# Patient Record
Sex: Male | Born: 1945 | Race: White | Hispanic: No | Marital: Married | Smoking: Never smoker
Health system: Southern US, Community
[De-identification: ages and names within clinical notes are randomized; demographics above are authoritative.]

## PROBLEM LIST (undated history)

## (undated) DIAGNOSIS — K219 Gastro-esophageal reflux disease without esophagitis: Secondary | ICD-10-CM

## (undated) DIAGNOSIS — Z951 Presence of aortocoronary bypass graft: Secondary | ICD-10-CM

## (undated) DIAGNOSIS — I8500 Esophageal varices without bleeding: Secondary | ICD-10-CM

## (undated) DIAGNOSIS — E119 Type 2 diabetes mellitus without complications: Secondary | ICD-10-CM

## (undated) DIAGNOSIS — K7469 Other cirrhosis of liver: Secondary | ICD-10-CM

## (undated) DIAGNOSIS — E079 Disorder of thyroid, unspecified: Secondary | ICD-10-CM

## (undated) DIAGNOSIS — E785 Hyperlipidemia, unspecified: Secondary | ICD-10-CM

## (undated) DIAGNOSIS — I2510 Atherosclerotic heart disease of native coronary artery without angina pectoris: Secondary | ICD-10-CM

## (undated) DIAGNOSIS — C801 Malignant (primary) neoplasm, unspecified: Secondary | ICD-10-CM

## (undated) DIAGNOSIS — I509 Heart failure, unspecified: Secondary | ICD-10-CM

## (undated) HISTORY — DX: Gastro-esophageal reflux disease without esophagitis: K21.9

## (undated) HISTORY — PX: CORONARY ARTERY BYPASS GRAFT: SHX141

## (undated) HISTORY — DX: Heart failure, unspecified: I50.9

## (undated) HISTORY — DX: Atherosclerotic heart disease of native coronary artery without angina pectoris: I25.10

## (undated) HISTORY — DX: Malignant (primary) neoplasm, unspecified: C80.1

## (undated) HISTORY — DX: Hyperlipidemia, unspecified: E78.5

## (undated) HISTORY — DX: Presence of aortocoronary bypass graft: Z95.1

## (undated) HISTORY — DX: Disorder of thyroid, unspecified: E07.9

## (undated) HISTORY — DX: Type 2 diabetes mellitus without complications: E11.9

---

## 2006-10-08 ENCOUNTER — Emergency Department: Payer: Self-pay | Admitting: Unknown Physician Specialty

## 2010-03-15 ENCOUNTER — Inpatient Hospital Stay: Payer: Self-pay | Admitting: Specialist

## 2011-02-11 ENCOUNTER — Ambulatory Visit: Payer: Self-pay | Admitting: Internal Medicine

## 2011-02-26 ENCOUNTER — Inpatient Hospital Stay: Payer: Self-pay | Admitting: *Deleted

## 2011-03-14 ENCOUNTER — Ambulatory Visit: Payer: Self-pay | Admitting: Internal Medicine

## 2011-04-13 ENCOUNTER — Ambulatory Visit: Payer: Self-pay | Admitting: Internal Medicine

## 2014-07-13 ENCOUNTER — Ambulatory Visit: Payer: Self-pay | Admitting: Oncology

## 2014-07-24 ENCOUNTER — Inpatient Hospital Stay: Payer: Self-pay | Admitting: Internal Medicine

## 2014-07-29 ENCOUNTER — Ambulatory Visit: Payer: Self-pay | Admitting: Oncology

## 2014-07-29 ENCOUNTER — Ambulatory Visit
Admit: 2014-07-29 | Disposition: A | Discharge status: Home / Self Care | Payer: Self-pay | Attending: Oncology | Admitting: Oncology

## 2014-08-11 ENCOUNTER — Ambulatory Visit
Admit: 2014-08-11 | Disposition: A | Discharge status: Home / Self Care | Payer: Self-pay | Attending: Oncology | Admitting: Oncology

## 2014-08-12 ENCOUNTER — Ambulatory Visit
Admit: 2014-08-12 | Disposition: A | Discharge status: Home / Self Care | Payer: Self-pay | Attending: Oncology | Admitting: Oncology

## 2014-08-12 ENCOUNTER — Ambulatory Visit: Payer: Self-pay | Admitting: Oncology

## 2014-08-20 ENCOUNTER — Ambulatory Visit: Payer: Self-pay | Admitting: Vascular Surgery

## 2014-09-04 LAB — CBC CANCER CENTER
Basophil #: 0 x10 3/mm (ref 0.0–0.1)
Basophil %: 1.4 %
EOS ABS: 0 x10 3/mm (ref 0.0–0.7)
Eosinophil %: 2.4 %
HCT: 31.8 % — AB (ref 40.0–52.0)
HGB: 10.5 g/dL — ABNORMAL LOW (ref 13.0–18.0)
LYMPHS ABS: 0.5 x10 3/mm — AB (ref 1.0–3.6)
Lymphocyte %: 29.1 %
MCH: 28.9 pg (ref 26.0–34.0)
MCHC: 33.1 g/dL (ref 32.0–36.0)
MCV: 87 fL (ref 80–100)
Monocyte #: 0 x10 3/mm — ABNORMAL LOW (ref 0.2–1.0)
Monocyte %: 1.7 %
Neutrophil #: 1.1 x10 3/mm — ABNORMAL LOW (ref 1.4–6.5)
Neutrophil %: 65.4 %
PLATELETS: 82 x10 3/mm — AB (ref 150–440)
RBC: 3.64 10*6/uL — ABNORMAL LOW (ref 4.40–5.90)
RDW: 17.7 % — AB (ref 11.5–14.5)
WBC: 1.6 x10 3/mm — CL (ref 3.8–10.6)

## 2014-09-04 LAB — COMPREHENSIVE METABOLIC PANEL
ALBUMIN: 3.8 g/dL
ALK PHOS: 51 U/L
ALT: 18 U/L
Anion Gap: 9 (ref 7–16)
BILIRUBIN TOTAL: 0.8 mg/dL
BUN: 10 mg/dL
Calcium, Total: 8.7 mg/dL — ABNORMAL LOW
Chloride: 104 mmol/L
Co2: 25 mmol/L
Creatinine: 0.67 mg/dL
EGFR (African American): >60
EGFR (Non-African Amer.): >60
Glucose: 180 mg/dL — ABNORMAL HIGH
Potassium: 3.6 mmol/L
SGOT(AST): 34 U/L
Sodium: 138 mmol/L
Total Protein: 6.6 g/dL

## 2014-09-10 LAB — CBC CANCER CENTER
BASOS ABS: 0 x10 3/mm (ref 0.0–0.1)
Basophil %: 2.8 %
Eosinophil #: 0 x10 3/mm (ref 0.0–0.7)
Eosinophil %: 3.1 %
HCT: 33.4 % — ABNORMAL LOW (ref 40.0–52.0)
HGB: 10.7 g/dL — ABNORMAL LOW (ref 13.0–18.0)
LYMPHS ABS: 0.4 x10 3/mm — AB (ref 1.0–3.6)
LYMPHS PCT: 44.6 %
MCH: 27.8 pg (ref 26.0–34.0)
MCHC: 32 g/dL (ref 32.0–36.0)
MCV: 87 fL (ref 80–100)
MONOS PCT: 23.7 %
Monocyte #: 0.2 x10 3/mm (ref 0.2–1.0)
NEUTROS PCT: 25.8 %
Neutrophil #: 0.2 x10 3/mm — ABNORMAL LOW (ref 1.4–6.5)
PLATELETS: 90 x10 3/mm — AB (ref 150–440)
RBC: 3.85 10*6/uL — ABNORMAL LOW (ref 4.40–5.90)
RDW: 17 % — AB (ref 11.5–14.5)
WBC: 0.8 x10 3/mm — AB (ref 3.8–10.6)

## 2014-09-10 LAB — COMPREHENSIVE METABOLIC PANEL
AST: 33 U/L
Albumin: 3.8 g/dL
Alkaline Phosphatase: 52 U/L
Anion Gap: 8 (ref 7–16)
BUN: 8 mg/dL
Bilirubin,Total: 0.5 mg/dL
CALCIUM: 9.4 mg/dL
CO2: 26 mmol/L
Chloride: 104 mmol/L
Creatinine: 0.68 mg/dL
EGFR (Non-African Amer.): >60
Glucose: 176 mg/dL — ABNORMAL HIGH
Potassium: 3.8 mmol/L
SGPT (ALT): 17 U/L
Sodium: 138 mmol/L
TOTAL PROTEIN: 6.6 g/dL

## 2014-09-10 LAB — CREATININE, SERUM: CREATINE, SERUM: 0.68

## 2014-09-11 ENCOUNTER — Ambulatory Visit
Admit: 2014-09-11 | Disposition: A | Discharge status: Home / Self Care | Payer: Self-pay | Attending: Oncology | Admitting: Oncology

## 2014-09-16 LAB — COMPREHENSIVE METABOLIC PANEL
ALBUMIN: 3.7 g/dL
ALK PHOS: 77 U/L
ANION GAP: 8 (ref 7–16)
AST: 42 U/L — AB
BUN: 9 mg/dL
Bilirubin,Total: 0.7 mg/dL
CALCIUM: 9.3 mg/dL
CHLORIDE: 103 mmol/L
Co2: 26 mmol/L
Creatinine: 0.66 mg/dL
EGFR (Non-African Amer.): >60
Glucose: 185 mg/dL — ABNORMAL HIGH
POTASSIUM: 3.7 mmol/L
SGPT (ALT): 22 U/L
Sodium: 137 mmol/L
Total Protein: 6.9 g/dL

## 2014-09-16 LAB — CBC CANCER CENTER
BASOS PCT: 0.4 %
Basophil #: 0 x10 3/mm (ref 0.0–0.1)
EOS ABS: 0.1 x10 3/mm (ref 0.0–0.7)
EOS PCT: 2.4 %
HCT: 34 % — AB (ref 40.0–52.0)
HGB: 11.1 g/dL — ABNORMAL LOW (ref 13.0–18.0)
LYMPHS PCT: 7.3 %
Lymphocyte #: 0.2 x10 3/mm — ABNORMAL LOW (ref 1.0–3.6)
MCH: 27.8 pg (ref 26.0–34.0)
MCHC: 32.5 g/dL (ref 32.0–36.0)
MCV: 86 fL (ref 80–100)
Monocyte #: 0.3 x10 3/mm (ref 0.2–1.0)
Monocyte %: 9.5 %
Neutrophil #: 2.7 x10 3/mm (ref 1.4–6.5)
Neutrophil %: 80.4 %
Platelet: 70 x10 3/mm — ABNORMAL LOW (ref 150–440)
RBC: 3.98 10*6/uL — ABNORMAL LOW (ref 4.40–5.90)
RDW: 18.4 % — ABNORMAL HIGH (ref 11.5–14.5)
WBC: 3.3 x10 3/mm — AB (ref 3.8–10.6)

## 2014-09-23 LAB — COMPREHENSIVE METABOLIC PANEL
ALK PHOS: 50 U/L
ALT: 17 U/L
Albumin: 3.9 g/dL
Anion Gap: 7 (ref 7–16)
BILIRUBIN TOTAL: 1 mg/dL
BUN: 11 mg/dL
CREATININE: 0.67 mg/dL
Calcium, Total: 9.2 mg/dL
Chloride: 102 mmol/L
Co2: 25 mmol/L
EGFR (African American): >60
EGFR (Non-African Amer.): >60
GLUCOSE: 192 mg/dL — AB
Potassium: 3.7 mmol/L
SGOT(AST): 31 U/L
Sodium: 134 mmol/L — ABNORMAL LOW
TOTAL PROTEIN: 6.7 g/dL

## 2014-09-23 LAB — CBC CANCER CENTER
BASOS ABS: 0 x10 3/mm (ref 0.0–0.1)
Basophil %: 0.4 %
Eosinophil #: 0 x10 3/mm (ref 0.0–0.7)
Eosinophil %: 2.7 %
HCT: 34.8 % — AB (ref 40.0–52.0)
HGB: 11.3 g/dL — ABNORMAL LOW (ref 13.0–18.0)
LYMPHS PCT: 11.4 %
Lymphocyte #: 0.1 x10 3/mm — ABNORMAL LOW (ref 1.0–3.6)
MCH: 27.7 pg (ref 26.0–34.0)
MCHC: 32.5 g/dL (ref 32.0–36.0)
MCV: 85 fL (ref 80–100)
Monocyte #: 0 x10 3/mm — ABNORMAL LOW (ref 0.2–1.0)
Monocyte %: 2 %
NEUTROS ABS: 0.8 x10 3/mm — AB (ref 1.4–6.5)
Neutrophil %: 83.5 %
PLATELETS: 59 x10 3/mm — AB (ref 150–440)
RBC: 4.08 10*6/uL — ABNORMAL LOW (ref 4.40–5.90)
RDW: 18.4 % — ABNORMAL HIGH (ref 11.5–14.5)
WBC: 0.9 x10 3/mm — CL (ref 3.8–10.6)

## 2014-09-30 LAB — CBC CANCER CENTER
BASOS PCT: 0.7 %
Basophil #: 0 x10 3/mm (ref 0.0–0.1)
Eosinophil #: 0 x10 3/mm (ref 0.0–0.7)
Eosinophil %: 1.3 %
HCT: 34.8 % — AB (ref 40.0–52.0)
HGB: 11.2 g/dL — AB (ref 13.0–18.0)
LYMPHS ABS: 0.1 x10 3/mm — AB (ref 1.0–3.6)
LYMPHS PCT: 7.8 %
MCH: 27.1 pg (ref 26.0–34.0)
MCHC: 32.2 g/dL (ref 32.0–36.0)
MCV: 84 fL (ref 80–100)
Monocyte #: 0.2 x10 3/mm (ref 0.2–1.0)
Monocyte %: 11.7 %
Neutrophil #: 1.5 x10 3/mm (ref 1.4–6.5)
Neutrophil %: 78.5 %
Platelet: 68 x10 3/mm — ABNORMAL LOW (ref 150–440)
RBC: 4.14 10*6/uL — ABNORMAL LOW (ref 4.40–5.90)
RDW: 19.2 % — ABNORMAL HIGH (ref 11.5–14.5)
WBC: 1.9 x10 3/mm — CL (ref 3.8–10.6)

## 2014-09-30 LAB — COMPREHENSIVE METABOLIC PANEL
ALBUMIN: 3.8 g/dL
ALK PHOS: 62 U/L
ALT: 16 U/L — AB
AST: 38 U/L
Anion Gap: 10 (ref 7–16)
BILIRUBIN TOTAL: 0.6 mg/dL
BUN: 6 mg/dL
CHLORIDE: 104 mmol/L
CREATININE: 0.64 mg/dL
Calcium, Total: 9.1 mg/dL
Co2: 25 mmol/L
EGFR (African American): >60
EGFR (Non-African Amer.): >60
Glucose: 198 mg/dL — ABNORMAL HIGH
POTASSIUM: 3.6 mmol/L
Sodium: 139 mmol/L
TOTAL PROTEIN: 6.6 g/dL

## 2014-10-05 LAB — SURGICAL PATHOLOGY

## 2014-10-07 LAB — CBC CANCER CENTER
Basophil #: 0 x10 3/mm (ref 0.0–0.1)
Basophil %: 0.8 %
EOS PCT: 0.9 %
Eosinophil #: 0 x10 3/mm (ref 0.0–0.7)
HCT: 34.2 % — ABNORMAL LOW (ref 40.0–52.0)
HGB: 11.1 g/dL — ABNORMAL LOW (ref 13.0–18.0)
LYMPHS ABS: 0.1 x10 3/mm — AB (ref 1.0–3.6)
LYMPHS PCT: 8.9 %
MCH: 27.1 pg (ref 26.0–34.0)
MCHC: 32.6 g/dL (ref 32.0–36.0)
MCV: 83 fL (ref 80–100)
MONO ABS: 0.1 x10 3/mm — AB (ref 0.2–1.0)
Monocyte %: 6 %
NEUTROS PCT: 83.4 %
Neutrophil #: 1 x10 3/mm — ABNORMAL LOW (ref 1.4–6.5)
PLATELETS: 61 x10 3/mm — AB (ref 150–440)
RBC: 4.12 10*6/uL — ABNORMAL LOW (ref 4.40–5.90)
RDW: 19.3 % — AB (ref 11.5–14.5)
WBC: 1.2 x10 3/mm — CL (ref 3.8–10.6)

## 2014-10-10 ENCOUNTER — Other Ambulatory Visit: Payer: Self-pay | Admitting: Oncology

## 2014-10-10 DIAGNOSIS — C159 Malignant neoplasm of esophagus, unspecified: Secondary | ICD-10-CM

## 2014-10-11 NOTE — Consult Note (Signed)
Pt CT scan showed a 2.7 by 3.3 cm mass at GEJ.No evidence of mediastinal or thoracic lymphadenopathy.  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 Will await brushings pathological exam.  Will possibly need to go back and biopsy the small mass.  Hgb today was 9.6 VSS, He reports no nausea, vomitin, or lite headedness.  Electronic Signatures: Manya Silvas (MD)  (Signed on 14-Feb-16 15:36)  Authored  Last Updated: 14-Feb-16 15:36 by Manya Silvas (MD)

## 2014-10-11 NOTE — Consult Note (Signed)
Pt with equivocal findings at GEJ, possible ulcerated tumor, possible Mallory Weiss tear. Concerned about possible serious bleeding from a biopsy of the ratty looking tissue and elected to brush it first.  Brushings sent for cytology.  Will repeat EGD in  few days if brushings not diagnostic.  Clear liq with Ensure for now.  Pt with low albumin and low plt ct concerned about possible cirrhosis.  No hx of known liver disease or jaundice.  Electronic Signatures: Manya Silvas (MD)  (Signed on 13-Feb-16 11:51)  Authored  Last Updated: 13-Feb-16 11:51 by Manya Silvas (MD)

## 2014-10-11 NOTE — Consult Note (Signed)
PATIENT NAME:  Mark Ford, Mark Ford MR#:  [id number] DATE OF BIRTH:  08-Oct-1945  DATE OF CONSULTATION:  07/24/2014  REFERRING PHYSICIAN:   CONSULTING PHYSICIAN:  Manya Silvas, MD  HISTORY OF PRESENT ILLNESS:  The patient is a 69 year old white male who is a veteran, who had the onset of vomiting maroon vomitus about 10:00 this morning, did it again about an hour later, came to the ER and was found to have melena and hemoglobin lower than previous and was admitted to the hospital for upper GI bleeding. I was asked to see him in consultation.   The patient says he has never vomited blood before.   The patient is not a Jehovah's Witness.   The patient denies any history of peptic ulcer disease, cirrhosis of the liver, loss of consciousness, dysphagia, diarrhea, or constipation. No asthma, wheezing. He has some recent cough, but no history of ulcer disease.  Is not a smoker or drinker. Was in the service for several years and worked at [employer] for 13 years, now is retired.   PAST SURGICAL HISTORY:  Coronary artery bypass graft surgery 2011 at [facility], surgery for broken leg on the right.   OTHER MEDICAL PROBLEMS: Include a history of diabetes for 3 years, takes pills but no insulin, has a history of gout and takes allopurinol daily, Indocin rarely for flares.   CURRENT MEDICATIONS: Glipizide 10 mg a day, aspirin 81 mg a day, allopurinol 300 mg a day, Zocor 40 mg a day, metformin 500 mg b.i.d., levothyroxine 50 mcg a day, indomethacin 50  mg p.o. q. 8 hours p.r.n.   PHYSICAL EXAMINATION:  GENERAL: White male in no acute distress.  HEENT: Sclerae not icteric. Conjunctivae negative. Tongue negative. Head is atraumatic. Trachea is in the midline.  CHEST: Clear.  HEART: Shows a 1/6 systolic murmur.  ABDOMEN: Bowel sounds present. No hepatosplenomegaly. No masses. No bruits. No significant tenderness.  VITAL SIGNS: Temperature 97.7, pulse 88, respirations 20, blood pressure 165/70.   LABORATORY DATA:  Glucose 151, BUN 26, creatinine 0.9, sodium 143, potassium 5.1, chloride 108, CO2 of 25, calcium 9.1. Lipase 123. Total protein 6.5, albumin 3, total bilirubin 0.7, alkaline phosphatase 50, SGOT 46, SGPT 25. White count 7.1, hemoglobin 12.6, platelet count 92,000.   ASSESSMENT: Upper gastrointestinal bleed, etiology unknown, most likely to be due to duodenal or gastric ulcer, possibility of gastritis or duodenitis, less likely possibility of a malignancy of the stomach, also possibility of arteriovenous malformations of the stomach or esophagitis.   PLAN: Upper endoscopy tomorrow. I explained the procedure to the patient.      ____________________________ Manya Silvas, MD rte:bu D: 07/24/2014 18:02:11 ET T: 07/24/2014 18:52:34 ET JOB#: [id number]  cc: Manya Silvas, MD, <Dictator> Manya Silvas MD ELECTRONICALLY SIGNED 08/15/2014 10:47

## 2014-10-11 NOTE — Consult Note (Signed)
Reason for Visit: This 69 year old Male patient presents to the clinic for initial evaluation of  esophageal cancer .   Referred by Dr. Grayland Ormond.  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 Upper GI demonstrated a medium-sized infiltrative ulcerated non-circumferential mass in the cardiac of the stomach extending to the GE junction. Patient also had grade 1 Biopsy was positive for moderately differentiated adenocarcinoma HER-2/neu negative. CT scan demonstrated soft tissue prominence at the GE junction with no other evidence of metastatic disease. 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 He underwent endoscopic ultrasound showing extensive of disease into the muscularis propria but not through it. A T2 lesion. PET/CT demonstrated hypermetabolic activity in the proximal stomach along the lesser curvature distal to the GE junction. Patient has been evaluated and presented at our weekly tumor conference and recommendation was made for neoadjuvant chemoradiation prior to surgical resection. He is seen today for opinion. He is doing fairly well. He is having no more nausea although does take antinausea medication. No further episodes of hematemesis.  Past Hx:    GERD:    Hypothyroidism:    Hypercholesterolemia:    gout:    CABG (Coronary Artery Bypass Graft):   Past, Family and Social History:  Past Medical History positive   Cardiovascular CABG performed; coronary artery disease; hyperlipidemia    Endocrine diabetes mellitus; hypothyroidism   Past Medical History Comments gout   Family History positive   Family History Comments family history of coronary artery disease   Social History positive   Social History Comments patient denies smoking or EtOH use history although significant history in past.   Additional Past Medical and Surgical History accompanied by his significant other today   Allergies:   No Known Allergies:   Home Meds:  Home Medications: Medication Instructions Status  pantoprazole 40 mg oral delayed release tablet 1 tab(s) orally 2 times a day Active  ferrous sulfate 325 mg oral tablet 1 tab(s) orally once a day (in the morning) Active  allopurinol 300 mg oral tablet 1 tab(s) orally once a day (in the morning) Active  GlipiZIDE XL 10 mg oral tablet, extended release 1 tab(s) orally once a day (in the morning) Active  levothyroxine 88 mcg (0.088 mg) oral tablet 1 tab(s) orally once a day (in the morning) Active  simvastatin 40 mg oral tablet 1 tab(s) orally once a day (at bedtime) Active  metFORMIN 1000 mg oral tablet 1 tab(s) orally 2 times a day Active   Review of Systems:  General negative   Performance Status (ECOG) 0   Skin negative   Breast negative   Ophthalmologic negative   ENMT negative   Respiratory and Thorax negative   Cardiovascular negative   Gastrointestinal see HPI   Genitourinary negative   Musculoskeletal negative   Neurological negative   Psychiatric negative   Hematology/Lymphatics negative   Endocrine negative   Allergic/Immunologic negative   Review of Systems   denies any weight loss, fatigue, weakness, fever, chills or night sweats. Patient denies any loss of vision, blurred vision. Patient  denies any ringing  of the ears or hearing loss. No irregular heartbeat. Patient denies heart murmur or history of fainting. Patient denies any chest pain or pain radiating to her upper extremities. Patient denies any  shortness of breath, difficulty breathing at night, cough or hemoptysis. Patient denies any swelling in the lower legs. Patient denies any nausea vomiting, vomiting of blood, or coffee ground material in the vomitus. Patient denies any stomach pain. Patient states has had normal bowel movements no significant constipation or diarrhea. Patient denies any dysuria, hematuria or significant nocturia. Patient denies any problems walking, swelling in the joints or loss of balance. Patient denies any skin changes, loss of hair or loss of weight. Patient denies any excessive worrying or anxiety or significant depression. Patient denies any problems with insomnia. Patient denies excessive thirst, polyuria, polydipsia. Patient denies any swollen glands, patient denies easy bruising or easy bleeding. Patient denies any recent infections, allergies or URI. Patient "s visual fields have not changed significantly in recent time.   Nursing Notes:  Nursing Vital Signs and Chemo Nursing Nursing Notes: *CC Vital Signs Flowsheet:   03-Mar-16 09:13  Temp Temperature 98.1  Pulse Pulse 85  Respirations Respirations 18  SBP SBP 149  DBP DBP 66  Pain Scale (0-10)  0  Current Weight (kg) (kg) 94.5  Height (cm) centimeters 168  BSA (m2) 2   Physical Exam:  General/Skin/HEENT:  General normal   Skin normal   Eyes normal   ENMT normal   Head and Neck normal   Additional PE well-developed slightly obese male in NAD. Skin no cervical or subclavicular adenopathy is appreciated. Lungs are clear to A&P cardiac examination shows regular rate and rhythm. Abdomen is benign. Positive bowel sounds in all 4 quadrants.No lower extremity edema is noted.   Breasts/Resp/CV/GI/GU:  Respiratory and Thorax normal   Cardiovascular normal   Gastrointestinal normal   Genitourinary normal   MS/Neuro/Psych/Lymph:  Musculoskeletal normal   Neurological normal   Lymphatics normal   Other Results:  Radiology  Results: LabUnknown:    14-Feb-16 13:02, CT Chest and Abd With Contrast  PACS Image   CT:  CT Chest and Abd With Contrast   REASON FOR EXAM:    (1) esophageal ulcerated mass; (2) esophageal   ulcerated mass  COMMENTS:       PROCEDURE: CT  - CT CHEST AND ABDOMEN W  - Jul 26 2014  1:02PM     CLINICAL DATA:  Recently diagnosed esophageal mass by endoscopy.  Hematemesis.    EXAM:  CT CHEST AND ABDOMEN WITH CONTRAST    TECHNIQUE:  Multidetector CT imaging of the chest and abdomen was performed  during bolus administration of intravenous contrast.  CONTRAST:  100 mL Omnipaque 350    COMPARISON:  None.    FINDINGS:  CT CHESTFINDINGS    Mediastinum/Lymph Nodes: Soft tissue prominence is seen at the  gastroesophageal junction which measures approximately 2.7 x 3.3 cm  on image 42/series 2. This likely represents the esophageal mass  recently diagnosed by endoscopy. No evidence of mediastinal or other  thoracic lymphadenopathy. No evidence of esophageal dilatation.    Lungs/Pleura: Mild atelectasis or scarring seen in both lung bases.  A tiny calcified granuloma is seen in the lateral right lung base,  but no suspicious pulmonary nodules are identified. No evidence  pulmonary infiltrate.    Musculoskeletal/Soft Tissues: No suspicious bone lesions or other  significant chest wall abnormality.    CT ABDOMEN FINDINGS  Hepatobiliary: Probable hepatic cirrhosis noted,however no liver  masses are identified. Recanalization of the periumbilical veins is  demonstrated as well as mild esophageal varices, consistent with  portal venous hypertension. No evidence of ascites. Gallbladder is  unremarkable.    Pancreas: [place], inflammatory changes, or other parenchymal  abnormality identified.    Spleen: Mild moderate splenomegaly noted with spleen measuring  approximately 16 cm in consistent with portal venous hypertension.  No splenic masses identified.    Adrenal Glands:  No  mass identified.    Kidneys:  No masses identified.  No evidence of hydronephrosis.    Stomach/Bowel/Peritoneum: Visualized portions within the abdomen are  unremarkable.    Vascular/Lymphatic: No pathologically enlarged lymph nodes  identified. No other significant abnormality noted.  Other:  None.    Musculoskeletal:  No suspicious bone lesions identified.     IMPRESSION:  Soft tissue prominence at gastroesophageal junction, likely  representing the esophageal mass recently diagnosedby endoscopy.  Correlation with results of endoscopy biopsy recommended.    No evidence of metastatic disease within the chest or abdomen.    Hepatic cirrhosis with associated findings of portal venous  hypertension including splenomegaly, paraesophageal varices, and  recanalization of periumbilical veins. No evidence of hepatic  neoplasm.  Electronically Signed    By: Earle Gell M.D.    On: 07/26/2014 15:05         Verified By: Marlaine Hind, M.D.,  Nuclear Med:    02-Mar-16 11:09, PET/CT Scan Esophageal CA Initial Staging  PET/CT Scan Esophageal CA Initial Staging   REASON FOR EXAM:    Initial Staging of Adenocarcinoma of GE Junction  COMMENTS:       PROCEDURE: PET - PET/CT INIT STG ESOPHAGUS CA  - Aug 12 2014 11:09AM     CLINICAL DATA:  Initial treatment strategy for adenocarcinoma at the  gastroesophageal junction.    EXAM:  NUCLEAR MEDICINE PET SKULL BASE TO THIGH    TECHNIQUE:  11.9 mCi F-18 FDG was injected intravenously. Full-ring PET imaging  was performed from the skull base to thigh after the radiotracer. CT  data was obtained and used for attenuation correction and anatomic  localization.    FASTING BLOOD GLUCOSE:  Value: 170 mg/dl    COMPARISON:  CT of the chest abdomen and pelvis 07/26/2014.    FINDINGS:  NECK    No hypermetabolic lymph nodes in the neck.    CHEST    No hypermetabolism noted in the esophagus. No hypermetabolic  mediastinal or hilar nodes. No  suspicious pulmonary nodules on the  CT scan. Small calcified granuloma in the periphery of the right  lower lobe. There is atherosclerosis of the thoracic aorta, the  great vessels of the mediastinum and the coronary arteries,  including calcified atherosclerotic plaque in the left main, left  anterior descending, left circumflex and right coronary arteries.  Status post median sternotomy for CABG, including LIMA to the LAD.    ABDOMEN/PELVIS    Small focus of hypermetabolism along the lesser curvature of the  stomach immediately distal to the gastroesophageal junction (SUVmax  = 6.6). However, there is also relatively diffuse hypermetabolism  along more distal aspects of thelesser curvature of the stomach  (SUVmax = 4.9- 6.0). No well-defined soft tissue mass is confidently  identified on the CT portion of the examination. The stomach is  decompressed, and the gastric mucosa appears diffusely thickened  (likely related to decompression of the stomach). No abnormal  hypermetabolic activity within the liver,  pancreas, adrenal glands,  or spleen. Diffuse low attenuation throughout the hepatic  parenchyma, compatible with hepatic steatosis. Liver has a shrunken  appearance and a nodular contour, compatible with cirrhosis.  Recannulized paraumbilical vein. Dilated portal vein (18 mm in  diameter). The spleen is mildly enlarged measuring 14.0 x 6.0 x 17.4  cm (estimated splenic volume of 731 mL). No hypermetabolic lymph  nodes in the abdomen or pelvis. Diffuse hypermetabolism throughout  the rectum and colon is presumably which may indicate underlying  proctocolitis. Numerous colonic diverticulae are noted, without  focal areas of surrounding inflammatory change to strongly suggest  an acute diverticulitis at this time.  SKELETON    No focal hypermetabolic activity to suggest skeletal metastasis.     IMPRESSION:  1. There is a small focus of hypermetabolism in the proximal  stomach  along the lesser curvature just distal to the gastroesophageal  junction, which may correlate to the known adenocarcinoma near the  GE junction. However, there is relatively diffuse low-level  hypermetabolism along other portions of the lesser curvature of the  stomach.  2. No other sites of hypermetabolism noted elsewhere in the neck,  chest, abdomen or pelvis to suggest presence of metastatic disease.  3. There is relatively diffuse hypermetabolic activity throughout  the rectum and colon, which is favored to reflect a proctocolitis.  4. Hepatic steatosis with evidence of cirrhosis, and portal  hypertension, including splenomegaly.  5. Colonic diverticulosis.  6. Atherosclerosis, including left main and 3 vessel coronary artery  disease. Status post median sternotomy for CABG, including LIMA to  the LAD.      Electronically Signed    By: Vinnie Langton M.D.    On: 08/12/2014 13:46         Verified By: Etheleen Mayhew, M.D.,   Relevent Results:   Relevant Scans and Labs PET/CT scan and CT scans are reviewed   Assessment and Plan: Impression:   stage IB large moderately differential adenocarcinoma of the GE junction in 69 year old male Plan:   case was presented her weekly tumor conference. Even though this is a stage IB it appears to be more aggressive large mass involving the GE junction and cardia of the stomach. These are classified as distal esophageal adenocarcinomas. Although definitive treatment guidelines are intubate current recommendation is for neoadjuvant chemoradiation prior to surgical resection of these lesions. I would plan on delivering 5000 cGy with concurrent chemotherapy over 5 weeks using IM RT treatment planning and delivery to spare critical structures such as the small bowel, spinal cord, liver, kidneys heart and chest. Risks and benefits of treatment including increased probability of nausea, skin reaction, fatigue, alteration of blood counts,  all were described in detail to the patient and his wife. I have set up and ordered CT simulation for next week. I discussed the case personally with medical oncology. Patient may have a port placed for chemotherapy and we will coordinate his chemotherapy after simulation next week.  I would like to take this opportunity for allowing me to participate in the care of your patient..  Fax to Physician:  Physicians To Recieve Fax: Jarome Lamas, MD - [phone].  Electronic Signatures: Jemel Ono, Roda Shutters (MD)  (Signed 03-Mar-16 11:46)  Authored: HPI, Diagnosis, Past Hx, PFSH, Allergies, Home Meds, ROS, Nursing Notes, Physical Exam, Other Results, Relevent Results, Encounter Assessment and Plan, Fax to Physician   Last Updated: 03-Mar-16 11:46 by Armstead Peaks (MD)

## 2014-10-11 NOTE — H&P (Signed)
PATIENT NAME:  Mark Ford, Mark Ford MR#:  [id number] DATE OF BIRTH:  1946-04-01  DATE OF ADMISSION:  07/24/2014  PRIMARY CARE PHYSICIAN: No local.  EMERGENCY ROOM PHYSICIAN: Dr. Kerman Passey.  CHIEF COMPLAINT: Vomiting blood today.   HISTORY OF PRESENT ILLNESS: A 69 year old Caucasian male with a history of CAD, diabetes, hyperlipidemia, GERD, who presented to the ED with the above chief complaint. The patient is alert, awake, oriented, in no acute distress. According to the patient, the patient was fine until today and the patient started vomiting maroon vomitus about 2 hours before he came to the ED. In addition, the patient noted some melena. The patient denies any headache or dizziness. No chest pain or palpitation. The patient denies any other symptoms. The patient's hemoglobin was 14, but it decreased to 12.6. Dr. Kerman Passey discussed with GI on-call physician, Dr. Gustavo Lah, who suggested to admit the patient and start Protonix drip.   PAST MEDICAL HISTORY: CAD, diabetes, hyperlipidemia, GERD, and thrombocytopenia, hypothyroidism.  SOCIAL HISTORY: No smoking, alcohol drinking, or illicit drugs.   PAST SURGICAL HISTORY: CABG, right leg surgery.   FAMILY HISTORY: Father and mother had heart disease start off in their old age. No stroke or MI.   ALLERGIES: None.  HOME MEDICATIONS: Glipizide 10 mg p.o. daily, aspirin 81 mg p.o. daily, allopurinol 300 mg p.o. daily, Zocor 40 mg p.o. daily, metformin 500 mg p.o. b.i.d., levothyroxine 50 mcg p.o. daily, indomethacin 250 mg p.o. every 8 hours p.r.n.   REVIEW OF SYSTEMS: CONSTITUTIONAL: The patient denies any fever or chills. No headache or dizziness. No weakness.  EYES: No double vision, blurred vision.  EARS, NOSE, AND THROAT: No postnasal drip, slurred speech, or dysphagia.  CARDIOVASCULAR: No chest pain, palpitation, orthopnea, or nocturnal dyspnea. No leg edema.  PULMONARY: No cough, sputum, shortness of breath, or hematemesis.   GASTROINTESTINAL: Positive for hemoptysis and melena, but no abdominal pain, nausea, vomiting, or diarrhea.  GENITOURINARY: No dysuria, hematuria, or incontinence.  SKIN: No rash or jaundice.  NEUROLOGIC: No syncope, loss of consciousness, or seizure.  ENDOCRINOLOGY: No polyuria, polydipsia, heat or cold intolerance.   PHYSICAL EXAMINATION: VITAL SIGNS: Temperature 98, blood pressure 119/56, pulse 84, oxygen saturation 98% on room air.  GENERAL: The patient is alert, awake, oriented, in no acute distress, obese.  HEENT: Pupils round, equal and reactive to light and accommodation. Moist oral mucosa. Clear oropharynx.  NECK: Supple. No JVD or carotid bruit. No lymphadenopathy. No thyromegaly.  CARDIOVASCULAR: S1, S2, regular rate and rhythm. No murmurs or gallops.  PULMONARY: Bilateral air entry. No wheezing or rales. No use of accessory muscle to breathe.  ABDOMEN: Soft. No distention or tenderness. No organomegaly. Bowel sounds present.  EXTREMITIES: No edema, clubbing, or cyanosis. No calf tenderness. Bilateral pedal pulses present.  SKIN: No rash or jaundice.  NEUROLOGICAL: Alert and oriented x 3. No focal deficit. Power 5/5. Sensory intact.   LABORATORY DATA: Troponin less than 0.02. WBC 7.1, hemoglobin 12.6, platelets 92,000. Glucose 151, BUN 26, creatinine 0.9. Electrolytes normal. Lipase of 123.   IMPRESSIONS: 1. Acute gastrointestinal bleeding.  2. Anemia, possibly due to acute blood loss.  3. History of coronary artery disease.  4. Diabetes, hyperlipidemia, thrombocytopenia, obesity.   PLAN OF TREATMENT: 1. The patient will be admitted to the medical floor. We will continue Protonix drip and  octreotide drip, and follow up hemoglobin q. 6 hours. Follow up with GI.  2. Hold aspirin.  3. For coronary artery disease, continue statin.  4. For diabetes, we will  start a sliding scale.  5. For hypothyroidism, continue thyroxine.  6. I discussed the patient's condition and plan of  treatment with the patient.   CODE STATUS: The patient wants full code.   TIME SPENT: About 55 minutes.    ____________________________ Demetrios Loll, MD qc:mw D: 07/24/2014 15:19:59 ET T: 07/24/2014 15:53:15 ET JOB#: [id number]  cc: Demetrios Loll, MD, <Dictator> Demetrios Loll MD ELECTRONICALLY SIGNED 07/24/2014 17:27

## 2014-10-11 NOTE — Consult Note (Signed)
Still waiting on pathology for report on esoph brushings.  Labs show a low B-12 and he should go on supplements.  Will give sub Q to start today.  Hgb 9.9, WBC 3.9. plt ct 62.  No new complaints.  Has poor venous access and may need a PIC line if needs to stay in hospital.  Electronic Signatures: Manya Silvas (MD)  (Signed on 16-Feb-16 11:01)  Authored  Last Updated: 16-Feb-16 11:01 by Manya Silvas (MD)

## 2014-10-11 NOTE — Op Note (Signed)
PATIENT NAME:  Mark Ford, Mark Ford MR#:  [id number] DATE OF BIRTH:  August 29, 1945  DATE OF PROCEDURE:  08/20/2014  PREOPERATIVE DIAGNOSES:  1.  Esophageal cancer with poor venous access.  2.  Coronary disease.  3.  Diabetes.   POSTOPERATIVE DIAGNOSES: 1.  Esophageal cancer with poor venous access.  2.  Coronary disease.  3.  Diabetes.   PROCEDURES:  1.  Ultrasound guidance for vascular access, right internal jugular vein.  2.  Fluoroscopic guidance for placement of catheter.  3.  Placement of CT compatible Port-A-Cath, right internal jugular vein.   SURGEON:  Algernon Huxley, MD  ANESTHESIA:  Local with moderate conscious sedation.   FLUOROSCOPY TIME:  Less than 1 minute.   CONTRAST:  Zero.   ESTIMATED BLOOD LOSS:  25 mL.  INDICATION FOR PROCEDURE: This is a 69 year old male with cancer of the esophagogastric junction. He needs a Port-A-Cath for chemotherapy and durable venous access.  Risks and benefits were discussed. Informed consent was obtained.   DESCRIPTION OF THE PROCEDURE:  The patient was brought to the vascular and interventional radiology suite. The right neck and chest were sterilely prepped and draped, and a sterile surgical field was created. Ultrasound was used to help visualize a patent right internal jugular vein. This was then accessed under direct ultrasound guidance without difficulty with a Seldinger needle and a permanent image was recorded. A J-wire was placed. After skin nick and dilatation, the peel-away sheath was then placed over the wire. I then anesthetized an area under the clavicle approximately 2 fingerbreadths. A transverse incision was created and an inferior pocket was created with electrocautery and blunt dissection. The port was then brought onto the field, placed into the pocket and secured to the chest wall with 2 Prolene sutures. The catheter was connected to the port and tunneled from the subclavicular incision to the access site. Fluoroscopic guidance was  used to cut the catheter to an appropriate length. The catheter was then placed through the peel-away sheath and the peel-away sheath was removed. The catheter tip was parked in excellent location in the mid to distal superior vena cava.  The pocket was then irrigated with antibiotic-impregnated saline and the wound was closed with a running 3-0 Vicryl and a 4-0 Monocryl. The access incision was closed with a single 4-0 Monocryl. The Huber needle was used to withdraw blood and flush the port with heparinized saline. Dermabond was then placed as a dressing. The patient tolerated the procedure well and was taken to the recovery room in stable condition.    ____________________________ Algernon Huxley, MD jsd:tr D: 08/20/2014 16:01:25 ET T: 08/20/2014 16:32:16 ET JOB#: [id number]  cc: Algernon Huxley, MD, <Dictator> Algernon Huxley MD ELECTRONICALLY SIGNED 08/31/2014 14:59

## 2014-10-11 NOTE — Consult Note (Signed)
Pt with small ulcerated mass at cardia, biopsied multiple times.  His cytology came back showing no malignancy so the biopsies were done.  Electronic Signatures for Addendum Section: Manya Silvas (MD)  (Signed Addendum 17-Feb-16 13:04) he can gp home  today on mechanical soft diet and see me in office Monday at 4pm.  Electronic Signatures: Manya Silvas (MD) (Signed on 17-Feb-16 13:03)  Authored   Last Updated: 17-Feb-16 13:04 by Manya Silvas (MD)

## 2014-10-11 NOTE — Consult Note (Signed)
Note Type Consult   Subjective: Chief Complaint/Diagnosis:   thrombocytopenia. HPI:   Patient is a 69 year old male who presented to the emergency room with complaint of vomiting blood. Patient states his symptoms started several hours before arrival. Upon workup, patient was also noted to be thrombocytopenic. EGD results from yesterday noted. Currently, patient feels well and nearly back to his baseline. He complains of no further hematemesis. He has no neurologic complaints. He denies any fevers or illnesses. He denies any weight loss. He has no chest pain or shortness of breath. He denies any constipation or diarrhea. He has no melena or hematochezia. He denies any pain. He has no urinary complaints. Patient otherwise feels well and offers no further specific complaints.   Review of Systems:  Performance Status (ECOG): 0  Review of Systems:   As per HPI. Otherwise, 10 point system review was negative.   Allergies:  No Known Allergies:   PFSH: Additional Past Medical and Surgical History: CAD, diabetes, hyperlipidemia, GERD, hypothyroidism, CABG, right leg surgery.    Family history: CAD.    Social history: Patient denies tobacco and alcohol.   Home Medications: Medication Instructions Last Modified Date/Time  aspirin 81 mg oral tablet 1 tab(s) orally once a day (in the morning) 12-Feb-16 15:04  ferrous sulfate 325 mg oral tablet 1 tab(s) orally once a day (in the morning) 12-Feb-16 15:04  allopurinol 300 mg oral tablet 1 tab(s) orally once a day (in the morning) 12-Feb-16 15:04  GlipiZIDE XL 10 mg oral tablet, extended release 1 tab(s) orally once a day (in the morning) 12-Feb-16 15:04  levothyroxine 88 mcg (0.088 mg) oral tablet 1 tab(s) orally once a day (in the morning) 12-Feb-16 15:04  simvastatin 40 mg oral tablet 1 tab(s) orally once a day (at bedtime) 12-Feb-16 15:04  metFORMIN 1000 mg oral tablet 1 tab(s) orally 2 times a day 12-Feb-16 15:04   Vital Signs:  :: vital  signs stable, patient afebrile.   Physical Exam:  General: well developed, well nourished, and in no acute distress  Mental Status: normal affect  Eyes: anicteric sclera  Head, Ears, Nose,Throat: Normocephalic, moist mucous membranes, clear oropharynx without erythema or thrush.  Neck, Thyroid: No palpable lymphadenopathy, thyroid midline without nodules.  Respiratory: clear to auscultation bilaterally  Cardiovascular: regular rate and rhythm, no murmur, rub, or gallop  Gastrointestinal: soft, nondistended, nontender, no organomegaly.  normal active bowel sounds  Musculoskeletal: No edema  Skin: No rash or petechiae noted  Neurological: alert, answering all questions appropriately.  Cranial nerves grossly intact   Laboratory Results: Routine Chem:  13-Feb-16 01:04   Glucose, Serum  127  BUN  29  Creatinine (comp) 0.97  Sodium, Serum  146  Potassium, Serum 4.1  Chloride, Serum  113  CO2, Serum 26  Calcium (Total), Serum  8.3  Anion Gap 7  Osmolality (calc) 298  eGFR (African American) >60  eGFR (Non-African American) >60 (eGFR values <26m/min/1.73 m2 may be an indication of chronic kidney disease (CKD). Calculated eGFR, using the MRDR Study equation, is useful in  patients with stable renal function. The eGFR calculation will not be reliable in acutely ill patients when serum creatinine is changing rapidly. It is not useful in patients on dialysis. The eGFR calculation may not be applicable to patients at the low and high extremes of body sizes, pregnant women, and vegetarians.)  Magnesium, Serum 1.8 (1.8-2.4 THERAPEUTIC RANGE: 4-7 mg/dL TOXIC: > 10 mg/dL  -----------------------)  14-Feb-16 04:19   Glucose, Serum  136  BUN 14  Creatinine (comp) 0.90  Sodium, Serum  146  Potassium, Serum 3.6  Chloride, Serum  109  CO2, Serum 28  Calcium (Total), Serum  8.0  Anion Gap 9  Osmolality (calc) 293  eGFR (African American) >60  eGFR (Non-African American) >60 (eGFR  values <68m/min/1.73 m2 may be an indication of chronic kidney disease (CKD). Calculated eGFR, using the MRDR Study equation, is useful in  patients with stable renal function. The eGFR calculation will not be reliable in acutely ill patients when serum creatinine is changing rapidly. It is not useful in patients on dialysis. The eGFR calculation may not be applicable to patients at the low and high extremes of body sizes, pregnant women, and vegetarians.)  Routine Hem:  13-Feb-16 01:04   Hemoglobin (CBC)  10.6  WBC (CBC) 4.6  RBC (CBC)  3.39  Hematocrit (CBC)  32.5  Platelet Count (CBC)  77  MCV 96  MCH 31.4  MCHC 32.8  RDW  14.8  Neutrophil % 59.5  Lymphocyte % 31.3  Monocyte % 6.7  Eosinophil % 2.0  Basophil % 0.5  Neutrophil # 2.8  Lymphocyte # 1.5  Monocyte # 0.3  Eosinophil # 0.1  Basophil # 0.0 (Result(s) reported on 25 Jul 2014 at 01:34AM.)  14-Feb-16 04:19   Hemoglobin (CBC)  9.6 (Result(s) reported on 26 Jul 2014 at 05:26AM.)   Assessment and Plan: Impression:   Thrombocytopenia in the setting of GI bleed. Plan:   1. Thrombocytopenia: In the setting of GI bleed, this is likely a consumptive process. Will initiate a full workup for completeness which is pending at time of dictation. No intervention is needed at this time. Patient does not require bone marrow biopsy. Once patient is discharged, please have him follow-up in the [place] 2-3 weeks for repeat laboratory work and further evaluation.Anemia: Likely secondary to GI bleed. Appreciate GI input, EGD results noted. No intervention needed at this time. Monitor. consult, call with questions.  Electronic Signatures: FDelight Hoh(MD)  (Signed 14-Feb-16 09:22)  Authored: Note Type, CC/HPI, Review of Systems, ALLERGIES, Patient Family Social History, HOME MEDICATIONS, Vital Signs, Physical Exam, Lab Results Review, Assessment and Plan   Last Updated: 14-Feb-16 09:22 by FDelight Hoh(MD)

## 2014-10-11 NOTE — Discharge Summary (Signed)
PATIENT NAME:  Mark Ford, Mark Ford MR#:  [id number] DATE OF BIRTH:  [birth year]  DATE OF ADMISSION:  07/24/2014 DATE OF DISCHARGE:  07/29/2014  PRIMARY CARE PHYSICIAN:  Nonlocal.  DISCHARGE DIAGNOSES: 1.  Acute upper gastrointestinal bleeding possibly due to ulcerated mass.  2.  Anemia due to acute blood loss.  3.  Grade 1 esophageal varices.  4.  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  5.  Coronary artery disease. 6.  Thrombocytopenia.  7.  Low vitamin B12.  PROCEDURE:  EGD twice with biopsy.  CONDITION: Stable.   CODE STATUS: Full code.   HOME MEDICATIONS: Please refer to the medication reconciliation list.   DIET: Low-sodium, low-fat, low-cholesterol, ADA diet.   ACTIVITY: As tolerated.   FOLLOWUP CARE:  Follow up with PCP within 1-2 weeks.  Follow up with Dr. Vira Agar within  1 week.  Also, patient needs followup with Dr. Grayland Ormond for thrombocytopenia.   REASON FOR ADMISSION: Vomiting blood 1 day.   HISTORY OF PRESENT ILLNESS: A 69 year old Caucasian male with a history of CAD, diabetes, hyperlipidemia, who presented to the ED with vomiting blood. The patient's hemoglobin was 14, but it decreased to 12.6. He was started with a Protonix drip in ED.  For detailed history and physical examination, please refer to the admission note dictated by me. 1.  For acute upper GI bleeding, the patient was treated with Protonix drip and octreotide drip.    Aspirin was discontinued. Dr. Vira Agar did an EGD with esophageal brushings of esophageal ulcer.  After EGD, Dr. Vira Agar suggested and changed to Protonix IV b.i.d. Since he suspected esophageal mass, he suggested CAT scan of abdomen and chest which showed possible esophageal mass without metastasis. Dr. Vira Agar did an EGD again today with biopsy which showed ulcerated mass at cardia. Dr. Vira Agar did a biopsy and suggested to continue Protonix p.o. b.i.d. He suggested patient be discharged to home today. 2. Grade  1 esophageal varices with hepatitic cirrhosis according to CAT scan of abdomen and pelvis. The patient needs to follow up with Dr. Vira Agar as outpatient.  3.  Anemia due to acute blood loss. The patient's hemoglobin decreased to 9.6 and then up to   9.9, so far is stable. The patient has no active bleeding. Need to follow up hemoglobin as outpatient.  4.  History of CAD, aspirin was discontinued. Patient is to continue statin.  5.  Thrombocytopenia.  According to Dr. Grayland Ormond, is stable. The patient needs to follow up with Dr. Grayland Ormond as outpatient.   The patient has no complaints, is status post EGD today. Vital signs are stable.   Physical examination is unremarkable.  The patient  is clinically stable, will be discharged to home today. I discussed the patient's discharge plan with the patient, the patient's wife and Dr. Vira Agar, nurse, and the case manager.   TIME SPENT: About 45 minutes.    ____________________________ Demetrios Loll, MD qc:LT D: 07/29/2014 17:17:00 ET T: 07/29/2014 17:53:29 ET JOB#: [id number]  cc: Demetrios Loll, MD, <Dictator>   Demetrios Loll MD ELECTRONICALLY SIGNED 07/30/2014 18:07

## 2014-10-11 NOTE — Consult Note (Signed)
Patient's platelet count is decreased, but relatively stable. Iron stores are borderline low, but he does not require IV Feraheme at this time. B12 levels were also found to be low and patient received 1000 g IM B-12 earlier today. Still awaiting pathology results from biopsy. Patient does not require bone marrow biopsy. No further intervention is needed at this time.  continue to follow.  Electronic Signatures: Finnegan, Timothy (MD)  (Signed on 16-Feb-16 13:25)  Authored  Last Updated: 16-Feb-16 13:25 by Finnegan, Timothy (MD)  

## 2014-10-11 NOTE — Consult Note (Signed)
Waiting on endoscopic brushings.  If they are diagnostic no further studies needed.  If not then need repeat EGD with biopsies.  Endoscopic ultrasound may be of great benefit for diagnostic evaluation and possible endoscopic removal if the EUS findings suggest that it can be done.  Pt understands we are waiting on the pathology before moving to next step.  Electronic Signatures: Manya Silvas (MD)  (Signed on 15-Feb-16 17:46)  Authored  Last Updated: 15-Feb-16 17:46 by Manya Silvas (MD)

## 2014-10-14 ENCOUNTER — Inpatient Hospital Stay: Payer: Medicare Other | Attending: Internal Medicine

## 2014-10-14 DIAGNOSIS — C160 Malignant neoplasm of cardia: Secondary | ICD-10-CM | POA: Diagnosis not present

## 2014-10-14 DIAGNOSIS — C159 Malignant neoplasm of esophagus, unspecified: Secondary | ICD-10-CM

## 2014-10-14 LAB — CBC WITH DIFFERENTIAL/PLATELET
Basophils Absolute: 0 10*3/uL (ref 0–0.1)
EOS ABS: 0 10*3/uL (ref 0–0.7)
Eosinophils Relative: 1 %
HCT: 32.6 % — ABNORMAL LOW (ref 40.0–52.0)
Hemoglobin: 10.5 g/dL — ABNORMAL LOW (ref 13.0–18.0)
Lymphocytes Relative: 20 %
Lymphs Abs: 0.5 10*3/uL — ABNORMAL LOW (ref 1.0–3.6)
MCH: 26.8 pg (ref 26.0–34.0)
MCHC: 32.3 g/dL (ref 32.0–36.0)
MCV: 83.1 fL (ref 80.0–100.0)
Monocytes Absolute: 0.5 10*3/uL (ref 0.2–1.0)
Neutro Abs: 1.3 10*3/uL — ABNORMAL LOW (ref 1.4–6.5)
Neutrophils Relative %: 56 %
PLATELETS: 91 10*3/uL — AB (ref 150–440)
RBC: 3.92 MIL/uL — ABNORMAL LOW (ref 4.40–5.90)
RDW: 20.7 % — ABNORMAL HIGH (ref 11.5–14.5)
WBC: 2.4 10*3/uL — ABNORMAL LOW (ref 3.8–10.6)

## 2014-10-20 ENCOUNTER — Emergency Department
Admission: EM | Admit: 2014-10-20 | Discharge: 2014-10-20 | Disposition: A | Discharge status: Home / Self Care | Payer: Medicare Other | Attending: Emergency Medicine | Admitting: Emergency Medicine

## 2014-10-20 ENCOUNTER — Telehealth: Payer: Self-pay | Admitting: Emergency Medicine

## 2014-10-20 DIAGNOSIS — K625 Hemorrhage of anus and rectum: Secondary | ICD-10-CM | POA: Diagnosis present

## 2014-10-20 DIAGNOSIS — Z79899 Other long term (current) drug therapy: Secondary | ICD-10-CM | POA: Insufficient documentation

## 2014-10-20 DIAGNOSIS — K644 Residual hemorrhoidal skin tags: Secondary | ICD-10-CM | POA: Insufficient documentation

## 2014-10-20 DIAGNOSIS — E119 Type 2 diabetes mellitus without complications: Secondary | ICD-10-CM | POA: Insufficient documentation

## 2014-10-20 LAB — COMPREHENSIVE METABOLIC PANEL
ALT: 20 U/L (ref 17–63)
ANION GAP: 10 (ref 5–15)
AST: 42 U/L — ABNORMAL HIGH (ref 15–41)
Albumin: 3.5 g/dL (ref 3.5–5.0)
Alkaline Phosphatase: 81 U/L (ref 38–126)
BUN: 7 mg/dL (ref 6–20)
CO2: 24 mmol/L (ref 22–32)
CREATININE: 0.58 mg/dL — AB (ref 0.61–1.24)
Calcium: 9.2 mg/dL (ref 8.9–10.3)
Chloride: 105 mmol/L (ref 101–111)
GFR calc non Af Amer: >60 mL/min (ref >60)
GLUCOSE: 125 mg/dL — AB (ref 65–99)
Potassium: 3.9 mmol/L (ref 3.5–5.1)
SODIUM: 139 mmol/L (ref 135–145)
TOTAL PROTEIN: 6.4 g/dL — AB (ref 6.5–8.1)
Total Bilirubin: 0.9 mg/dL (ref 0.3–1.2)

## 2014-10-20 LAB — CBC
HCT: 34.5 % — ABNORMAL LOW (ref 40.0–52.0)
Hemoglobin: 11.2 g/dL — ABNORMAL LOW (ref 13.0–18.0)
MCH: 27.3 pg (ref 26.0–34.0)
MCHC: 32.4 g/dL (ref 32.0–36.0)
MCV: 84.4 fL (ref 80.0–100.0)
PLATELETS: 85 10*3/uL — AB (ref 150–440)
RBC: 4.09 MIL/uL — AB (ref 4.40–5.90)
RDW: 22.7 % — ABNORMAL HIGH (ref 11.5–14.5)
WBC: 3.4 10*3/uL — ABNORMAL LOW (ref 3.8–10.6)

## 2014-10-20 MED ORDER — HYDROCORTISONE ACETATE 25 MG RE SUPP: 25.0000 mg | Freq: Two times a day (BID) | RECTAL | Status: DC

## 2014-10-20 NOTE — Discharge Instructions (Signed)

## 2014-10-20 NOTE — ED Provider Notes (Signed)
[facility] Emergency Department Provider Note  ____________________________________________  Time seen: 5:50 AM  I have reviewed the triage vital signs and the nursing notes.   HISTORY  Chief Complaint No chief complaint on file.      HPI Mark Ford is a 69 y.o. male presents with rectal bleeding since last Wednesday area patient admits to "knots inside his rectum". Patient denies any abdominal pain of note patient has a history of cirrhosis in addition to esophageal cancer for which he recently underwent radiation therapy and chemotherapy. Patient denies any history of hemorrhoids in the past.     Past Medical History  Diagnosis Date  . Cancer     adenocarcinoma fo the GE Junction  . GERD (gastroesophageal reflux disease)   . Diabetes mellitus without complication   . Thyroid disease     hypothroidism  . CAD (coronary artery disease)   . S/P CABG (coronary artery bypass graft)   . Hyperlipidemia     There are no active problems to display for this patient.   Past Surgical History  Procedure Laterality Date  . Coronary artery bypass graft      Current Outpatient Rx  Name  Route  Sig  Dispense  Refill  . allopurinol (ZYLOPRIM) 300 MG tablet   Oral   Take 300 mg by mouth daily. Take in the morning         . glipiZIDE (GLUCOTROL XL) 10 MG 24 hr tablet   Oral   Take 10 mg by mouth daily with breakfast.         . levothyroxine (SYNTHROID, LEVOTHROID) 100 MCG tablet   Oral   Take 100 mcg by mouth daily before breakfast.          . metFORMIN (GLUCOPHAGE) 1000 MG tablet   Oral   Take 1,000 mg by mouth 2 (two) times daily with a meal.         . simvastatin (ZOCOR) 40 MG tablet   Oral   Take 40 mg by mouth daily. Take at bedtime           Allergies Shellfish allergy and Tylenol  No family history on file.  Social History History  Substance Use Topics  . Smoking status: Not on file  . Smokeless tobacco: Not on  file  . Alcohol Use: Not on file    Review of Systems  Constitutional: Negative for fever. Eyes: Negative for visual changes. ENT: Negative for sore throat. Cardiovascular: Negative for chest pain. Respiratory: Negative for shortness of breath. Gastrointestinal: Negative for abdominal pain, vomiting and diarrhea. Genitourinary: Negative for dysuria. Positive rectal bleeding Musculoskeletal: Negative for back pain. Skin: Negative for rash. Neurological: Negative for headaches, focal weakness or numbness.   10-point ROS otherwise negative.  ____________________________________________   PHYSICAL EXAM:  VITAL SIGNS: ED Triage Vitals  Enc Vitals Group     BP 10/20/14 0336 115/71 mmHg     Pulse Rate 10/20/14 0336 89     Resp 10/20/14 0336 20     Temp 10/20/14 0336 98 F (36.7 C)     Temp Source 10/20/14 0336 Oral     SpO2 10/20/14 0336 96 %     Weight 10/20/14 0336 195 lb (88.451 kg)     Height 10/20/14 0336 5\' 6"  (1.676 m)     Head Cir --      Peak Flow --      Pain Score 10/20/14 0405 7     Pain Loc --  Pain Edu? --      Excl. in [place]? --      Constitutional: Alert and oriented. Well appearing and in no distress. Eyes: Conjunctivae are normal. PERRL. Normal extraocular movements. ENT   Head: Normocephalic and atraumatic.   Nose: No congestion/rhinnorhea.   Mouth/Throat: Mucous membranes are moist.   Neck: No stridor. Hematological/Lymphatic/Immunilogical: No cervical lymphadenopathy. Cardiovascular: Normal rate, regular rhythm. Normal and symmetric distal pulses are present in all extremities. No murmurs, rubs, or gallops. Respiratory: Normal respiratory effort without tachypnea nor retractions. Breath sounds are clear and equal bilaterally. No wheezes/rales/rhonchi. Gastrointestinal: Soft and nontender. No distention. There is no CVA tenderness. RECTAL: Patient has multiple external hemorrhoids noted on exam with active bleeding Genitourinary:  deferred Musculoskeletal: Nontender with normal range of motion in all extremities. No joint effusions.  No lower extremity tenderness nor edema. Neurologic:  Normal speech and language. No gross focal neurologic deficits are appreciated. Speech is normal.  Skin:  Skin is warm, dry and intact. No rash noted. Psychiatric: Mood and affect are normal. Speech and behavior are normal. Patient exhibits appropriate insight and judgment.  ____________________________________________    LABS (pertinent positives/negatives)  Labs Reviewed  CBC - Abnormal; Notable for the following:    WBC 3.4 (*)    RBC 4.09 (*)    Hemoglobin 11.2 (*)    HCT 34.5 (*)    RDW 22.7 (*)    Platelets 85 (*)    All other components within normal limits  COMPREHENSIVE METABOLIC PANEL - Abnormal; Notable for the following:    Glucose, Bld 125 (*)    Creatinine, Ser 0.58 (*)    Total Protein 6.4 (*)    AST 42 (*)    All other components within normal limits     ____________________________________________     ____________________________________________     ____________________________________________   INITIAL IMPRESSION / ASSESSMENT AND PLAN / ED COURSE  Pertinent labs & imaging results that were available during my care of the patient were reviewed by me and considered in my medical decision making (see chart for details).  History and physical exam consistent with external hemorrhoids. Patient's hemoglobin and hematocrit 11.2 and 34. As such we'll refer patient to general surgery for outpatient management.  ____________________________________________   FINAL CLINICAL IMPRESSION(S) / ED DIAGNOSES  Final diagnoses:  External hemorrhoids without complication      Gregor Hams, MD 10/21/14 2313

## 2014-10-20 NOTE — ED Notes (Signed)
Patient ambulatory to triage with steady gait, without difficulty or distress noted; mask in place; pt reports currently undergoing chemo and radiation for esophageal/stomach CA; st has noted some knots inside rectum that are bleeding since Wednesday; pt denies any accomp symptoms

## 2014-10-20 NOTE — ED Notes (Signed)
[facility] calling , pt is currently at the pharmacy waiting for RX that Dr.Brown wrote last night, review of chart finding Anusol (rectal) route RX,

## 2014-11-11 ENCOUNTER — Inpatient Hospital Stay: Payer: Medicare Other | Attending: Internal Medicine

## 2014-11-11 ENCOUNTER — Inpatient Hospital Stay: Payer: Medicare Other

## 2014-11-11 ENCOUNTER — Other Ambulatory Visit: Payer: Self-pay

## 2014-11-11 ENCOUNTER — Ambulatory Visit: Payer: Self-pay | Admitting: Oncology

## 2014-11-11 ENCOUNTER — Other Ambulatory Visit: Payer: Self-pay | Admitting: Oncology

## 2014-11-11 ENCOUNTER — Inpatient Hospital Stay (HOSPITAL_BASED_OUTPATIENT_CLINIC_OR_DEPARTMENT_OTHER): Payer: Medicare Other | Admitting: Oncology

## 2014-11-11 VITALS — BP 157/72 | HR 92 | Temp 97.8°F | Resp 20 | Wt 194.9 lb

## 2014-11-11 DIAGNOSIS — K219 Gastro-esophageal reflux disease without esophagitis: Secondary | ICD-10-CM | POA: Diagnosis not present

## 2014-11-11 DIAGNOSIS — E785 Hyperlipidemia, unspecified: Secondary | ICD-10-CM

## 2014-11-11 DIAGNOSIS — C801 Malignant (primary) neoplasm, unspecified: Secondary | ICD-10-CM

## 2014-11-11 DIAGNOSIS — E119 Type 2 diabetes mellitus without complications: Secondary | ICD-10-CM | POA: Insufficient documentation

## 2014-11-11 DIAGNOSIS — C159 Malignant neoplasm of esophagus, unspecified: Secondary | ICD-10-CM

## 2014-11-11 DIAGNOSIS — C160 Malignant neoplasm of cardia: Secondary | ICD-10-CM

## 2014-11-11 DIAGNOSIS — K229 Disease of esophagus, unspecified: Secondary | ICD-10-CM

## 2014-11-11 DIAGNOSIS — E039 Hypothyroidism, unspecified: Secondary | ICD-10-CM | POA: Diagnosis not present

## 2014-11-11 DIAGNOSIS — I2510 Atherosclerotic heart disease of native coronary artery without angina pectoris: Secondary | ICD-10-CM | POA: Diagnosis not present

## 2014-11-11 DIAGNOSIS — Z79899 Other long term (current) drug therapy: Secondary | ICD-10-CM

## 2014-11-11 DIAGNOSIS — Z452 Encounter for adjustment and management of vascular access device: Secondary | ICD-10-CM | POA: Diagnosis not present

## 2014-11-11 DIAGNOSIS — E079 Disorder of thyroid, unspecified: Secondary | ICD-10-CM

## 2014-11-11 DIAGNOSIS — Z923 Personal history of irradiation: Secondary | ICD-10-CM | POA: Insufficient documentation

## 2014-11-11 DIAGNOSIS — K7460 Unspecified cirrhosis of liver: Secondary | ICD-10-CM | POA: Diagnosis not present

## 2014-11-11 DIAGNOSIS — Z9221 Personal history of antineoplastic chemotherapy: Secondary | ICD-10-CM | POA: Insufficient documentation

## 2014-11-11 DIAGNOSIS — D696 Thrombocytopenia, unspecified: Secondary | ICD-10-CM | POA: Insufficient documentation

## 2014-11-11 DIAGNOSIS — D649 Anemia, unspecified: Secondary | ICD-10-CM | POA: Diagnosis not present

## 2014-11-11 DIAGNOSIS — D701 Agranulocytosis secondary to cancer chemotherapy: Secondary | ICD-10-CM | POA: Insufficient documentation

## 2014-11-11 DIAGNOSIS — J8410 Pulmonary fibrosis, unspecified: Secondary | ICD-10-CM | POA: Insufficient documentation

## 2014-11-11 DIAGNOSIS — Z951 Presence of aortocoronary bypass graft: Secondary | ICD-10-CM | POA: Insufficient documentation

## 2014-11-11 DIAGNOSIS — K766 Portal hypertension: Secondary | ICD-10-CM | POA: Diagnosis not present

## 2014-11-11 LAB — CBC WITH DIFFERENTIAL/PLATELET
Basophils Absolute: 0 10*3/uL (ref 0–0.1)
Basophils Relative: 1 %
Eosinophils Absolute: 0.1 10*3/uL (ref 0–0.7)
HCT: 37 % — ABNORMAL LOW (ref 40.0–52.0)
HEMOGLOBIN: 11.9 g/dL — AB (ref 13.0–18.0)
Lymphs Abs: 1 10*3/uL (ref 1.0–3.6)
MCH: 28.1 pg (ref 26.0–34.0)
MCHC: 32.2 g/dL (ref 32.0–36.0)
MCV: 87.2 fL (ref 80.0–100.0)
Monocytes Absolute: 0.5 10*3/uL (ref 0.2–1.0)
Monocytes Relative: 10 %
NEUTROS ABS: 2.9 10*3/uL (ref 1.4–6.5)
Neutrophils Relative %: 64 %
Platelets: 79 10*3/uL — ABNORMAL LOW (ref 150–440)
RBC: 4.24 MIL/uL — ABNORMAL LOW (ref 4.40–5.90)
RDW: 25.3 % — AB (ref 11.5–14.5)
WBC: 4.5 10*3/uL (ref 3.8–10.6)

## 2014-11-11 LAB — BASIC METABOLIC PANEL
ANION GAP: 6 (ref 5–15)
BUN: 12 mg/dL (ref 6–20)
CHLORIDE: 105 mmol/L (ref 101–111)
CO2: 25 mmol/L (ref 22–32)
Calcium: 9.8 mg/dL (ref 8.9–10.3)
Creatinine, Ser: 0.53 mg/dL — ABNORMAL LOW (ref 0.61–1.24)
GFR calc Af Amer: >60 mL/min (ref >60)
GFR calc non Af Amer: >60 mL/min (ref >60)
GLUCOSE: 103 mg/dL — AB (ref 65–99)
Potassium: 3.9 mmol/L (ref 3.5–5.1)
SODIUM: 136 mmol/L (ref 135–145)

## 2014-11-11 MED ORDER — HEPARIN SOD (PORK) LOCK FLUSH 100 UNIT/ML IV SOLN
INTRAVENOUS | Status: AC
  Filled 2014-11-11: qty 5

## 2014-11-11 MED ORDER — HEPARIN SOD (PORK) LOCK FLUSH 100 UNIT/ML IV SOLN
500.0000 [IU] | Freq: Once | INTRAVENOUS | Status: AC
  Administered 2014-11-11: 500 [IU] via INTRAVENOUS

## 2014-11-11 MED ORDER — SODIUM CHLORIDE 0.9 % IJ SOLN
10.0000 mL | INTRAMUSCULAR | Status: AC | PRN
  Administered 2014-11-11: 10 mL via INTRAVENOUS
  Filled 2014-11-11: qty 10

## 2014-11-11 NOTE — Progress Notes (Unsigned)
Survivorship Care visit completed.  Survivorship Care Plan given and explained to patient.  Resources given and talked about Cancer Transitions and CARE program.  [facility] answers Survivorship booklet given.  Patient verbalized understanding.  Encouraged patient and wife to utilized resources available.

## 2014-11-25 ENCOUNTER — Ambulatory Visit
Admission: RE | Admit: 2014-11-25 | Discharge: 2014-11-25 | Disposition: A | Discharge status: Home / Self Care | Payer: Medicare Other | Source: Ambulatory Visit | Attending: Radiation Oncology | Admitting: Radiation Oncology

## 2014-11-25 ENCOUNTER — Encounter: Payer: Self-pay | Admitting: Radiation Oncology

## 2014-11-25 VITALS — BP 158/71 | HR 88 | Temp 97.6°F | Wt 197.1 lb

## 2014-11-25 DIAGNOSIS — C801 Malignant (primary) neoplasm, unspecified: Secondary | ICD-10-CM

## 2014-11-25 NOTE — Progress Notes (Signed)
Radiation Oncology Follow up Note  Name: Mark Ford   Date:   11/25/2014 MRN:  [id number] DOB: [birth year]    This 69 y.o. 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 PET CT demonstrated hypermetabolic activity in the proximal stomach along the left lesser curvature distal to the GE junction. He is seen today in routine follow-up and is doing well. Specifically denies dysphagia cough or weight loss. He is scheduled for a PET CT scan in about a week's time. He also will be scheduled for follow-up upper endoscopy and surgical consultation in the near future by medical oncology..  COMPLICATIONS OF TREATMENT: none  FOLLOW UP COMPLIANCE: keeps appointments   PHYSICAL EXAM:  BP 158/71 mmHg  Pulse 88  Temp(Src) 97.6 F (36.4 C)  Wt 197 lb 1.5 oz (89.4 kg) Well-developed well-nourished patient in NAD. HEENT reveals PERLA, EOMI, discs not visualized.  Oral cavity is clear. No oral mucosal lesions are identified. Neck is clear without evidence of cervical or supraclavicular adenopathy. Lungs are clear to A&P. Cardiac examination is essentially unremarkable with regular rate and rhythm without murmur rub or thrill. Abdomen is benign with no organomegaly or masses noted. Motor sensory and DTR levels are equal and symmetric in the upper and lower extremities. Cranial nerves II through XII are grossly intact. Proprioception is intact. No peripheral adenopathy or edema is identified. No motor or sensory levels are noted. Crude visual fields are within normal range.   RADIOLOGY RESULTS: Will review PET CT scan when they  become available  PLAN: At the present time patient or to has follow-up appointment with medical oncology after completion of the PET CT scan in about a week's time. Patient will probably need repeat upper endoscopy in the near future and surgical consultation. I have asked to see him back in about 4 months for follow-up. I am please was overall progress. Patient is to call sooner with any concerns.  I would like to take this opportunity for allowing me to participate in the care of your patient.Armstead Peaks., MD

## 2014-12-02 ENCOUNTER — Ambulatory Visit
Admission: RE | Admit: 2014-12-02 | Discharge: 2014-12-02 | Disposition: A | Discharge status: Home / Self Care | Payer: Medicare Other | Source: Ambulatory Visit | Attending: Oncology | Admitting: Oncology

## 2014-12-02 DIAGNOSIS — K766 Portal hypertension: Secondary | ICD-10-CM | POA: Diagnosis not present

## 2014-12-02 DIAGNOSIS — C159 Malignant neoplasm of esophagus, unspecified: Secondary | ICD-10-CM | POA: Diagnosis present

## 2014-12-02 DIAGNOSIS — K7460 Unspecified cirrhosis of liver: Secondary | ICD-10-CM | POA: Insufficient documentation

## 2014-12-02 LAB — GLUCOSE, CAPILLARY: GLUCOSE-CAPILLARY: 142 mg/dL — AB (ref 65–99)

## 2014-12-02 MED ORDER — FLUDEOXYGLUCOSE F - 18 (FDG) INJECTION
13.1000 | Freq: Once | INTRAVENOUS | Status: AC | PRN
  Administered 2014-12-02: 13.1 via INTRAVENOUS

## 2014-12-07 ENCOUNTER — Other Ambulatory Visit: Payer: Self-pay | Admitting: *Deleted

## 2014-12-07 DIAGNOSIS — C159 Malignant neoplasm of esophagus, unspecified: Secondary | ICD-10-CM

## 2014-12-09 ENCOUNTER — Inpatient Hospital Stay (HOSPITAL_BASED_OUTPATIENT_CLINIC_OR_DEPARTMENT_OTHER): Payer: Medicare Other | Admitting: Oncology

## 2014-12-09 ENCOUNTER — Inpatient Hospital Stay: Payer: Medicare Other

## 2014-12-09 VITALS — BP 164/73 | HR 83 | Temp 98.2°F | Resp 20 | Wt 196.0 lb

## 2014-12-09 DIAGNOSIS — J8410 Pulmonary fibrosis, unspecified: Secondary | ICD-10-CM

## 2014-12-09 DIAGNOSIS — Z79899 Other long term (current) drug therapy: Secondary | ICD-10-CM

## 2014-12-09 DIAGNOSIS — D701 Agranulocytosis secondary to cancer chemotherapy: Secondary | ICD-10-CM | POA: Diagnosis not present

## 2014-12-09 DIAGNOSIS — D696 Thrombocytopenia, unspecified: Secondary | ICD-10-CM | POA: Diagnosis not present

## 2014-12-09 DIAGNOSIS — Z923 Personal history of irradiation: Secondary | ICD-10-CM

## 2014-12-09 DIAGNOSIS — K7460 Unspecified cirrhosis of liver: Secondary | ICD-10-CM

## 2014-12-09 DIAGNOSIS — C160 Malignant neoplasm of cardia: Secondary | ICD-10-CM | POA: Diagnosis not present

## 2014-12-09 DIAGNOSIS — D649 Anemia, unspecified: Secondary | ICD-10-CM

## 2014-12-09 DIAGNOSIS — E119 Type 2 diabetes mellitus without complications: Secondary | ICD-10-CM

## 2014-12-09 DIAGNOSIS — I2510 Atherosclerotic heart disease of native coronary artery without angina pectoris: Secondary | ICD-10-CM

## 2014-12-09 DIAGNOSIS — E079 Disorder of thyroid, unspecified: Secondary | ICD-10-CM

## 2014-12-09 DIAGNOSIS — C159 Malignant neoplasm of esophagus, unspecified: Secondary | ICD-10-CM

## 2014-12-09 DIAGNOSIS — E039 Hypothyroidism, unspecified: Secondary | ICD-10-CM

## 2014-12-09 DIAGNOSIS — Z452 Encounter for adjustment and management of vascular access device: Secondary | ICD-10-CM

## 2014-12-09 DIAGNOSIS — Z951 Presence of aortocoronary bypass graft: Secondary | ICD-10-CM

## 2014-12-09 DIAGNOSIS — K219 Gastro-esophageal reflux disease without esophagitis: Secondary | ICD-10-CM

## 2014-12-09 DIAGNOSIS — D61818 Other pancytopenia: Secondary | ICD-10-CM

## 2014-12-09 DIAGNOSIS — K766 Portal hypertension: Secondary | ICD-10-CM

## 2014-12-09 DIAGNOSIS — Z9221 Personal history of antineoplastic chemotherapy: Secondary | ICD-10-CM

## 2014-12-09 DIAGNOSIS — E785 Hyperlipidemia, unspecified: Secondary | ICD-10-CM

## 2014-12-09 LAB — COMPREHENSIVE METABOLIC PANEL
ALBUMIN: 3.9 g/dL (ref 3.5–5.0)
ALT: 21 U/L (ref 17–63)
ANION GAP: 5 (ref 5–15)
AST: 40 U/L (ref 15–41)
Alkaline Phosphatase: 79 U/L (ref 38–126)
BUN: 8 mg/dL (ref 6–20)
CHLORIDE: 104 mmol/L (ref 101–111)
CO2: 26 mmol/L (ref 22–32)
Calcium: 9.2 mg/dL (ref 8.9–10.3)
Creatinine, Ser: 0.72 mg/dL (ref 0.61–1.24)
GFR calc Af Amer: >60 mL/min (ref >60)
Glucose, Bld: 145 mg/dL — ABNORMAL HIGH (ref 65–99)
Potassium: 3.8 mmol/L (ref 3.5–5.1)
Sodium: 135 mmol/L (ref 135–145)
Total Bilirubin: 1 mg/dL (ref 0.3–1.2)
Total Protein: 7 g/dL (ref 6.5–8.1)

## 2014-12-09 LAB — CBC WITH DIFFERENTIAL/PLATELET
BASOS PCT: 1 %
Basophils Absolute: 0 10*3/uL (ref 0–0.1)
EOS ABS: 0.1 10*3/uL (ref 0–0.7)
Eosinophils Relative: 2 %
HCT: 36.6 % — ABNORMAL LOW (ref 40.0–52.0)
HEMOGLOBIN: 11.8 g/dL — AB (ref 13.0–18.0)
Lymphocytes Relative: 22 %
Lymphs Abs: 0.5 10*3/uL — ABNORMAL LOW (ref 1.0–3.6)
MCH: 28.5 pg (ref 26.0–34.0)
MCHC: 32.2 g/dL (ref 32.0–36.0)
MCV: 88.6 fL (ref 80.0–100.0)
MONO ABS: 0.2 10*3/uL (ref 0.2–1.0)
Monocytes Relative: 9 %
Neutro Abs: 1.6 10*3/uL (ref 1.4–6.5)
Neutrophils Relative %: 66 %
Platelets: 72 10*3/uL — ABNORMAL LOW (ref 150–440)
RBC: 4.13 MIL/uL — ABNORMAL LOW (ref 4.40–5.90)
RDW: 19.7 % — ABNORMAL HIGH (ref 11.5–14.5)
WBC: 2.4 10*3/uL — ABNORMAL LOW (ref 3.8–10.6)

## 2014-12-11 NOTE — Progress Notes (Signed)
[place]  Telephone:(336) [phone] Fax:(336) [phone]  ID: Mark Ford OB: [birth year]  MR#: [id number]  VXB#:[id number]  Patient Care Team: [place] Pink, MD as PCP - General (Family Medicine) Manya Silvas, MD (Gastroenterology)  CHIEF COMPLAINT:  Chief Complaint  Patient presents with  . Follow-up  . Chemotherapy    INTERVAL HISTORY: Patient returns to clinic today for his survivorship visit. He completed his chemotherapy and XRT on February 08, 2015. He currently feels well and is asymptomatic. He has no neurologic complaints. He denies any fevers or illnesses. He denies any weight loss. He has no chest pain or shortness of breath. He denies any constipation or diarrhea. He has no melena or hematochezia. He denies any pain. He has no urinary complaints. Patient offers no specific complaints today.   REVIEW OF SYSTEMS:   Review of Systems  Constitutional: Negative.   Respiratory: Negative.   Cardiovascular: Negative.   Gastrointestinal: Negative.     As per HPI. Otherwise, a complete review of systems is negatve.  PAST MEDICAL HISTORY: Past Medical History  Diagnosis Date  . Cancer     adenocarcinoma fo the GE Junction  . GERD (gastroesophageal reflux disease)   . Diabetes mellitus without complication   . Thyroid disease     hypothroidism  . CAD (coronary artery disease)   . S/P CABG (coronary artery bypass graft)   . Hyperlipidemia     PAST SURGICAL HISTORY: Past Surgical History  Procedure Laterality Date  . Coronary artery bypass graft      FAMILY HISTORY No family history on file.     ADVANCED DIRECTIVES:    HEALTH MAINTENANCE: History  Substance Use Topics  . Smoking status: Not on file  . Smokeless tobacco: Not on file  . Alcohol Use: Not on file     Colonoscopy:  PAP:  Bone density:  Lipid panel:  Allergies  Allergen Reactions  . Shellfish Allergy Other (See Comments)    GOUT  . Tylenol [Acetaminophen] Other  (See Comments)    Not allergic, but was told not to take it any more due to his liver condition.     Current Outpatient Prescriptions  Medication Sig Dispense Refill  . allopurinol (ZYLOPRIM) 300 MG tablet Take 300 mg by mouth daily. Take in the morning    . glipiZIDE (GLUCOTROL XL) 10 MG 24 hr tablet Take 10 mg by mouth daily with breakfast.    . hydrocortisone (ANUSOL-HC) 25 MG suppository Place 1 suppository (25 mg total) rectally every 12 (twelve) hours. 12 suppository 1  . levothyroxine (SYNTHROID, LEVOTHROID) 100 MCG tablet Take 100 mcg by mouth daily before breakfast.     . metFORMIN (GLUCOPHAGE) 1000 MG tablet Take 1,000 mg by mouth 2 (two) times daily with a meal.    . simvastatin (ZOCOR) 40 MG tablet Take 40 mg by mouth daily. Take at bedtime     No current facility-administered medications for this visit.   Facility-Administered Medications Ordered in Other Visits  Medication Dose Route Frequency Provider Last Rate Last Dose  . sodium chloride 0.9 % injection 10 mL  10 mL Intravenous PRN Lloyd Huger, MD   10 mL at 11/11/14 1045    OBJECTIVE: Filed Vitals:   11/11/14 1230  BP: 157/72  Pulse: 92  Temp: 97.8 F (36.6 C)  Resp: 20     Body mass index is 36.84 kg/(m^2).    ECOG FS:0 - Asymptomatic  General: Well-developed, well-nourished, no acute distress. Eyes: anicteric sclera.  Neck: No palpable lymphadenopathy.  Lungs: Clear to auscultation bilaterally. Heart: Regular rate and rhythm. No rubs, murmurs, or gallops. Abdomen: Soft, nontender, nondistended. No organomegaly noted, normoactive bowel sounds. Musculoskeletal: No edema, cyanosis, or clubbing. Neuro: Alert, answering all questions appropriately. Cranial nerves grossly intact. Skin: No rashes or petechiae noted. Psych: Normal affect.   LAB RESULTS:  Lab Results  Component Value Date   NA 135 12/09/2014   K 3.8 12/09/2014   CL 104 12/09/2014   CO2 26 12/09/2014   GLUCOSE 145* 12/09/2014   BUN 8  12/09/2014   CREATININE 0.72 12/09/2014   CALCIUM 9.2 12/09/2014   PROT 7.0 12/09/2014   ALBUMIN 3.9 12/09/2014   AST 40 12/09/2014   ALT 21 12/09/2014   ALKPHOS 79 12/09/2014   BILITOT 1.0 12/09/2014   GFRNONAA >60 12/09/2014   GFRAA >60 12/09/2014    Lab Results  Component Value Date   WBC 2.4* 12/09/2014   NEUTROABS 1.6 12/09/2014   HGB 11.8* 12/09/2014   HCT 36.6* 12/09/2014   MCV 88.6 12/09/2014   PLT 72* 12/09/2014     STUDIES: Nm Pet Image Restag (ps) Skull Base To Thigh  12/02/2014   CLINICAL DATA:  Subsequent treatment strategy for history of esophageal cancer, radiation therapy 4 weeks ago. Chemo therapy 6 weeks ago. Diabetes. Cirrhosis.  EXAM: NUCLEAR MEDICINE PET SKULL BASE TO THIGH  TECHNIQUE: 13.1 mCi F-18 FDG was injected intravenously. Full-ring PET imaging was performed from the skull base to thigh after the radiotracer. CT data was obtained and used for attenuation correction and anatomic localization.  FASTING BLOOD GLUCOSE:  Value: 142 mg/dl  COMPARISON:  08/12/2014  FINDINGS: NECK  No areas of abnormal hypermetabolism.  CHEST  No areas of abnormal hypermetabolism.  ABDOMEN/PELVIS  The previously described hypermetabolism about the proximal stomach is no longer identified. Again identified is relatively diffuse colonic hypermetabolism, without CT correlate.  SKELETON  No abnormal marrow activity.  CT IMAGES PERFORMED FOR ATTENUATION CORRECTION  Mucosal thickening of both maxillary sinuses. No cervical adenopathy. Bilateral carotid atherosclerosis. Mild cardiomegaly with prior median sternotomy for CABG. Calcified granuloma in the right lower lobe. Mild cirrhosis. Portal venous hypertension, as evidenced by a recannulized paraumbilical vein. Underdistended proximal stomach. Normal adrenal glands. Colonic diverticulosis.  IMPRESSION: 1. No evidence of hypermetabolic residual, recurrent, or metastatic disease. 2. Hypermetabolism throughout the colon, without CT correlate.  Favored to be physiologic. 3. Cirrhosis and portal venous hypertension, as before.   Electronically Signed   By: Kyle  Talbot M.D.   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  PLAN:    1. Esophageal adenocarcinoma: Patient completed his chemotherapy and concurrent XRT on October 09, 2014. We will schedule PET scan in the next 2-3 weeks with follow-up soon thereafter. Patient will also require an EGD in the near future. Because of patient's history CABG, he is refusing any additional chest surgery.  Patient expressed understanding and was in agreement with this plan. 2. Thrombocytopenia: Stable. Likely multifactorial. Patient will require a bone marrow biopsy at the conclusion of his treatments.  3. Anemia: Hemoglobin decreased, but stable. Monitor.  4. Neutropenia: Secondary chemotherapy. Cannot get Neulasta while patient is receiving XRT. 5. Survivorship: Patient's long-term followup, potential side effects, as well as laboratory work and chemotherapy were all discussed at length and in detail with the patient.   Patient expressed understanding and was in agreement with this plan. He also understands that He can call clinic at any   time with any questions, concerns, or complaints.   No matching staging information was found for the patient.  Timothy J Finnegan, MD   12/11/2014 6:47 PM     

## 2014-12-17 NOTE — Progress Notes (Signed)
[place]  Telephone:(336) [phone] Fax:(336) [phone]  ID: Mark Ford OB: Oct 23, 1945  MR#: [id number]  IEP#:[id number]  Patient Care Team: Maryland Pink, MD as PCP - General (Family Medicine) Manya Silvas, MD (Gastroenterology)  CHIEF COMPLAINT:  Chief Complaint  Patient presents with  . Follow-up    pet results    INTERVAL HISTORY: Patient returns to clinic today for routine evaluation and discussion of his imaging results. He continues to feel well and is asymptomatic. He has no neurologic complaints. He denies any fevers or illnesses. He denies any weight loss. He has no chest pain or shortness of breath. He denies any constipation or diarrhea. He has no melena or hematochezia. He denies any pain. He has no urinary complaints. Patient offers no specific complaints today.   REVIEW OF SYSTEMS:   Review of Systems  Constitutional: Negative.   Respiratory: Negative.   Cardiovascular: Negative.   Gastrointestinal: Negative.     As per HPI. Otherwise, a complete review of systems is negatve.  PAST MEDICAL HISTORY: Past Medical History  Diagnosis Date  . Cancer     adenocarcinoma fo the GE Junction  . GERD (gastroesophageal reflux disease)   . Diabetes mellitus without complication   . Thyroid disease     hypothroidism  . CAD (coronary artery disease)   . S/P CABG (coronary artery bypass graft)   . Hyperlipidemia     PAST SURGICAL HISTORY: Past Surgical History  Procedure Laterality Date  . Coronary artery bypass graft      FAMILY HISTORY No family history on file.     ADVANCED DIRECTIVES:    HEALTH MAINTENANCE: History  Substance Use Topics  . Smoking status: Not on file  . Smokeless tobacco: Not on file  . Alcohol Use: Not on file     Colonoscopy:  PAP:  Bone density:  Lipid panel:  Allergies  Allergen Reactions  . Shellfish Allergy Other (See Comments)    GOUT  . Tylenol [Acetaminophen] Other (See Comments)    Not  allergic, but was told not to take it any more due to his liver condition.     Current Outpatient Prescriptions  Medication Sig Dispense Refill  . allopurinol (ZYLOPRIM) 300 MG tablet Take 300 mg by mouth daily. Take in the morning    . glipiZIDE (GLUCOTROL XL) 10 MG 24 hr tablet Take 10 mg by mouth daily with breakfast.    . hydrocortisone (ANUSOL-HC) 25 MG suppository Place 1 suppository (25 mg total) rectally every 12 (twelve) hours. 12 suppository 1  . levothyroxine (SYNTHROID, LEVOTHROID) 100 MCG tablet Take 100 mcg by mouth daily before breakfast.     . metFORMIN (GLUCOPHAGE) 1000 MG tablet Take 1,000 mg by mouth 2 (two) times daily with a meal.    . simvastatin (ZOCOR) 40 MG tablet Take 40 mg by mouth daily. Take at bedtime     No current facility-administered medications for this visit.   Facility-Administered Medications Ordered in Other Visits  Medication Dose Route Frequency Provider Last Rate Last Dose  . sodium chloride 0.9 % injection 10 mL  10 mL Intravenous PRN Lloyd Huger, MD   10 mL at 11/11/14 1045    OBJECTIVE: Filed Vitals:   12/09/14 1040  BP: 164/73  Pulse: 83  Temp: 98.2 F (36.8 C)  Resp: 20     Body mass index is 32.61 kg/(m^2).    ECOG FS:0 - Asymptomatic  General: Well-developed, well-nourished, no acute distress. Eyes: anicteric sclera. Neck: No  palpable lymphadenopathy.  Lungs: Clear to auscultation bilaterally. Heart: Regular rate and rhythm. No rubs, murmurs, or gallops. Abdomen: Soft, nontender, nondistended. No organomegaly noted, normoactive bowel sounds. Musculoskeletal: No edema, cyanosis, or clubbing. Neuro: Alert, answering all questions appropriately. Cranial nerves grossly intact. Skin: No rashes or petechiae noted. Psych: Normal affect.   LAB RESULTS:  Lab Results  Component Value Date   NA 135 12/09/2014   K 3.8 12/09/2014   CL 104 12/09/2014   CO2 26 12/09/2014   GLUCOSE 145* 12/09/2014   BUN 8 12/09/2014    CREATININE 0.72 12/09/2014   CALCIUM 9.2 12/09/2014   PROT 7.0 12/09/2014   ALBUMIN 3.9 12/09/2014   AST 40 12/09/2014   ALT 21 12/09/2014   ALKPHOS 79 12/09/2014   BILITOT 1.0 12/09/2014   GFRNONAA >60 12/09/2014   GFRAA >60 12/09/2014    Lab Results  Component Value Date   WBC 2.4* 12/09/2014   NEUTROABS 1.6 12/09/2014   HGB 11.8* 12/09/2014   HCT 36.6* 12/09/2014   MCV 88.6 12/09/2014   PLT 72* 12/09/2014     STUDIES: Nm Pet Image Restag (ps) Skull Base To Thigh  12/02/2014   CLINICAL DATA:  Subsequent treatment strategy for history of esophageal cancer, radiation therapy 4 weeks ago. Chemo therapy 6 weeks ago. Diabetes. Cirrhosis.  EXAM: NUCLEAR MEDICINE PET SKULL BASE TO THIGH  TECHNIQUE: 13.1 mCi F-18 FDG was injected intravenously. Full-ring PET imaging was performed from the skull base to thigh after the radiotracer. CT data was obtained and used for attenuation correction and anatomic localization.  FASTING BLOOD GLUCOSE:  Value: 142 mg/dl  COMPARISON:  08/12/2014  FINDINGS: NECK  No areas of abnormal hypermetabolism.  CHEST  No areas of abnormal hypermetabolism.  ABDOMEN/PELVIS  The previously described hypermetabolism about the proximal stomach is no longer identified. Again identified is relatively diffuse colonic hypermetabolism, without CT correlate.  SKELETON  No abnormal marrow activity.  CT IMAGES PERFORMED FOR ATTENUATION CORRECTION  Mucosal thickening of both maxillary sinuses. No cervical adenopathy. Bilateral carotid atherosclerosis. Mild cardiomegaly with prior median sternotomy for CABG. Calcified granuloma in the right lower lobe. Mild cirrhosis. Portal venous hypertension, as evidenced by a recannulized paraumbilical vein. Underdistended proximal stomach. Normal adrenal glands. Colonic diverticulosis.  IMPRESSION: 1. No evidence of hypermetabolic residual, recurrent, or metastatic disease. 2. Hypermetabolism throughout the colon, without CT correlate. Favored to be  physiologic. 3. Cirrhosis and portal venous hypertension, as before.   Electronically Signed   By: Abigail Miyamoto M.D.   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  PLAN:    1. Esophageal adenocarcinoma: Patient completed his chemotherapy and concurrent XRT on October 09, 2014. PET scan results as above with no evidence of residual disease. No further intervention is needed at this time. Patient will require an EGD in the near future. Because of patient's history CABG, he is refusing any additional chest surgery.  Return to clinic in 3 months with laboratory work and further evaluation. Plan to reimage in approximately 6 months. 2. Thrombocytopenia: Stable. Likely multifactorial. Bone marrow biopsy was discussed with patient, but he is refusing this procedure at this time. Monitor.  3. Anemia: Hemoglobin decreased, but stable. Monitor.   Patient expressed understanding and was in agreement with this plan. He also understands that He can call clinic at any time with any questions, concerns, or complaints.   No matching staging information was found for the patient.  Lloyd Huger, MD  12/17/2014 2:30 PM

## 2015-02-11 ENCOUNTER — Encounter
Admission: RE | Disposition: A | Discharge status: Home / Self Care | Payer: Self-pay | Source: Ambulatory Visit | Attending: Unknown Physician Specialty

## 2015-02-11 ENCOUNTER — Ambulatory Visit: Payer: Medicare Other | Admitting: Anesthesiology

## 2015-02-11 ENCOUNTER — Ambulatory Visit
Admission: RE | Admit: 2015-02-11 | Discharge: 2015-02-11 | Disposition: A | Discharge status: Home / Self Care | Payer: Medicare Other | Source: Ambulatory Visit | Attending: Unknown Physician Specialty | Admitting: Unknown Physician Specialty

## 2015-02-11 ENCOUNTER — Encounter: Payer: Self-pay | Admitting: *Deleted

## 2015-02-11 DIAGNOSIS — E119 Type 2 diabetes mellitus without complications: Secondary | ICD-10-CM | POA: Insufficient documentation

## 2015-02-11 DIAGNOSIS — E039 Hypothyroidism, unspecified: Secondary | ICD-10-CM | POA: Insufficient documentation

## 2015-02-11 DIAGNOSIS — I8510 Secondary esophageal varices without bleeding: Secondary | ICD-10-CM | POA: Insufficient documentation

## 2015-02-11 DIAGNOSIS — Z8501 Personal history of malignant neoplasm of esophagus: Secondary | ICD-10-CM | POA: Diagnosis not present

## 2015-02-11 DIAGNOSIS — I2510 Atherosclerotic heart disease of native coronary artery without angina pectoris: Secondary | ICD-10-CM | POA: Diagnosis not present

## 2015-02-11 DIAGNOSIS — Z951 Presence of aortocoronary bypass graft: Secondary | ICD-10-CM | POA: Insufficient documentation

## 2015-02-11 DIAGNOSIS — Z886 Allergy status to analgesic agent status: Secondary | ICD-10-CM | POA: Insufficient documentation

## 2015-02-11 DIAGNOSIS — K219 Gastro-esophageal reflux disease without esophagitis: Secondary | ICD-10-CM | POA: Diagnosis not present

## 2015-02-11 DIAGNOSIS — E785 Hyperlipidemia, unspecified: Secondary | ICD-10-CM | POA: Diagnosis not present

## 2015-02-11 DIAGNOSIS — K3189 Other diseases of stomach and duodenum: Secondary | ICD-10-CM | POA: Insufficient documentation

## 2015-02-11 DIAGNOSIS — Z91013 Allergy to seafood: Secondary | ICD-10-CM | POA: Insufficient documentation

## 2015-02-11 DIAGNOSIS — K766 Portal hypertension: Secondary | ICD-10-CM | POA: Diagnosis not present

## 2015-02-11 DIAGNOSIS — Z09 Encounter for follow-up examination after completed treatment for conditions other than malignant neoplasm: Secondary | ICD-10-CM | POA: Diagnosis present

## 2015-02-11 HISTORY — PX: ESOPHAGOGASTRODUODENOSCOPY (EGD) WITH PROPOFOL: SHX5813

## 2015-02-11 LAB — GLUCOSE, CAPILLARY: GLUCOSE-CAPILLARY: 148 mg/dL — AB (ref 65–99)

## 2015-02-11 SURGERY — ESOPHAGOGASTRODUODENOSCOPY (EGD) WITH PROPOFOL
Anesthesia: General

## 2015-02-11 MED ORDER — SODIUM CHLORIDE 0.9 % IV SOLN
INTRAVENOUS | Status: DC
  Administered 2015-02-11: 1000 mL via INTRAVENOUS
  Administered 2015-02-11: 09:00:00 via INTRAVENOUS

## 2015-02-11 MED ORDER — SODIUM CHLORIDE 0.9 % IV SOLN: INTRAVENOUS | Status: DC

## 2015-02-11 MED ORDER — MIDAZOLAM HCL 5 MG/5ML IJ SOLN
INTRAMUSCULAR | Status: DC | PRN
  Administered 2015-02-11 (×2): .5 mg via INTRAVENOUS

## 2015-02-11 MED ORDER — LIDOCAINE HCL (CARDIAC) 20 MG/ML IV SOLN
INTRAVENOUS | Status: DC | PRN
Start: 2015-02-11 — End: 2015-02-11
  Administered 2015-02-11: 100 mg via INTRAVENOUS

## 2015-02-11 MED ORDER — PROPOFOL 10 MG/ML IV BOLUS
INTRAVENOUS | Status: DC | PRN
  Administered 2015-02-11 (×2): 25 mg via INTRAVENOUS
  Administered 2015-02-11: 10 mg via INTRAVENOUS
  Administered 2015-02-11: 15 mg via INTRAVENOUS
  Administered 2015-02-11: 10 mg via INTRAVENOUS

## 2015-02-11 MED ORDER — GLYCOPYRROLATE 0.2 MG/ML IJ SOLN
INTRAMUSCULAR | Status: DC | PRN
  Administered 2015-02-11: .3 mg via INTRAVENOUS

## 2015-02-11 MED ORDER — ALFENTANIL 500 MCG/ML IJ INJ
INJECTION | INTRAMUSCULAR | Status: DC | PRN
  Administered 2015-02-11: 500 ug via INTRAVENOUS

## 2015-02-11 NOTE — Anesthesia Postprocedure Evaluation (Signed)
  Anesthesia Post-op Note  Patient: Mark Ford  Procedure(s) Performed: Procedure(s): ESOPHAGOGASTRODUODENOSCOPY (EGD) WITH PROPOFOL (N/A)  Anesthesia type:General  Patient location: PACU  Post pain: Pain level controlled  Post assessment: Post-op Vital signs reviewed, Patient's Cardiovascular Status Stable, Respiratory Function Stable, Patent Airway and No signs of Nausea or vomiting  Post vital signs: Reviewed and stable  Last Vitals:  Filed Vitals:   02/11/15 1050  BP:   Pulse: 85  Temp:   Resp: 21    Level of consciousness: awake, alert  and patient cooperative  Complications: No apparent anesthesia complications

## 2015-02-11 NOTE — Transfer of Care (Signed)
Immediate Anesthesia Transfer of Care Note  Patient: Mark Ford  Procedure(s) Performed: Procedure(s): ESOPHAGOGASTRODUODENOSCOPY (EGD) WITH PROPOFOL (N/A)  Patient Location: PACU  Anesthesia Type:General  Level of Consciousness: awake and patient cooperative  Airway & Oxygen Therapy: Patient Spontanous Breathing and Patient connected to nasal cannula oxygen  Post-op Assessment: Report given to RN and Post -op Vital signs reviewed and stable  Post vital signs: Reviewed and stable  Last Vitals:  Filed Vitals:   02/11/15 1016  BP: 110/46  Pulse:   Temp: 36.4 C  Resp: 25    Complications: No apparent anesthesia complications

## 2015-02-11 NOTE — H&P (Signed)
   Primary Care Physician:  Maryland Pink, MD Primary Gastroenterologist:  Dr. Vira Agar  Pre-Procedure History & Physical: HPI:  Mark Ford is a 69 y.o. male is here for an endoscopy.   Past Medical History  Diagnosis Date  . Cancer     adenocarcinoma fo the GE Junction  . GERD (gastroesophageal reflux disease)   . Diabetes mellitus without complication   . Thyroid disease     hypothroidism  . CAD (coronary artery disease)   . S/P CABG (coronary artery bypass graft)   . Hyperlipidemia     Past Surgical History  Procedure Laterality Date  . Coronary artery bypass graft      Prior to Admission medications   Medication Sig Start Date End Date Taking? Authorizing Provider  allopurinol (ZYLOPRIM) 300 MG tablet Take 300 mg by mouth daily. Take in the morning   Yes Historical Provider, MD  furosemide (LASIX) 20 MG tablet Take 20 mg by mouth daily.   Yes Historical Provider, MD  glipiZIDE (GLUCOTROL XL) 10 MG 24 hr tablet Take 10 mg by mouth daily with breakfast.   Yes Historical Provider, MD  hydrocortisone (ANUSOL-HC) 25 MG suppository Place 1 suppository (25 mg total) rectally every 12 (twelve) hours. 10/20/14 10/20/15 Yes Gregor Hams, MD  levothyroxine (SYNTHROID, LEVOTHROID) 100 MCG tablet Take 100 mcg by mouth daily before breakfast.  08/24/14  Yes Historical Provider, MD  metFORMIN (GLUCOPHAGE) 1000 MG tablet Take 1,000 mg by mouth 2 (two) times daily with a meal.   Yes Historical Provider, MD  simvastatin (ZOCOR) 40 MG tablet Take 40 mg by mouth daily. Take at bedtime   Yes Historical Provider, MD    Allergies as of 02/01/2015 - Review Complete 12/09/2014  Allergen Reaction Noted  . Shellfish allergy Other (See Comments) 10/20/2014  . Tylenol [acetaminophen] Other (See Comments) 10/20/2014    History reviewed. No pertinent family history.  Social History   Social History  . Marital Status: Married    Spouse Name: N/A  . Number of Children: N/A  . Years of  Education: N/A   Occupational History  . Not on file.   Social History Main Topics  . Smoking status: Never Smoker   . Smokeless tobacco: Never Used  . Alcohol Use: No  . Drug Use: No  . Sexual Activity: Not on file   Other Topics Concern  . Not on file   Social History Narrative    Review of Systems: See HPI, otherwise negative ROS  Physical Exam: BP 146/62 mmHg  Pulse 86  Temp(Src) 97.7 F (36.5 C) (Tympanic)  Resp 20  Ht 5\' 7"  (1.702 m)  Wt 93.441 kg (206 lb)  BMI 32.26 kg/m2  SpO2 98% General:   Alert,  pleasant and cooperative in NAD Head:  Normocephalic and atraumatic. Neck:  Supple; no masses or thyromegaly. Lungs:  Clear throughout to auscultation.    Heart:  Regular rate and rhythm. Abdomen:  Soft, nontender and nondistended. Normal bowel sounds, without guarding, and without rebound.   Neurologic:  Alert and  oriented x4;  grossly normal neurologically.  Impression/Plan: Mark Ford is here for an endoscopy to be performed for follow up esophageal cancer  Risks, benefits, limitations, and alternatives regarding  endoscopy have been reviewed with the patient.  Questions have been answered.  All parties agreeable.   Gaylyn Cheers, MD  02/11/2015, 9:48 AM

## 2015-02-11 NOTE — Op Note (Signed)
[facility] Gastroenterology Patient Name: Mark Ford Procedure Date: 02/11/2015 9:47 AM MRN: [id number] Account #: [id number] Date of Birth: August 21, 1945 Admit Type: Outpatient Age: 69 Room: [facility] ENDO ROOM 1 Gender: Male Note Status: Finalized Procedure:         Upper GI endoscopy Indications:       Follow-up of malignant esophageal adenocarcinoma Providers:         Manya Silvas, MD Referring MD:      Irven Easterly. Kary Kos, MD (Referring MD) Medicines:         Propofol per Anesthesia Complications:     No immediate complications. Procedure:         Pre-Anesthesia Assessment:                    - After reviewing the risks and benefits, the patient was                     deemed in satisfactory condition to undergo the procedure.                    After obtaining informed consent, the endoscope was passed                     under direct vision. Throughout the procedure, the                     patient's blood pressure, pulse, and oxygen saturations                     were monitored continuously. The Endoscope was introduced                     through the mouth, and advanced to the second part of                     duodenum. The upper GI endoscopy was accomplished without                     difficulty. The patient tolerated the procedure well. 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      Moderate portal hypertensive gastropathy was found in the proximal       gastric body. Coagulation for tissue destruction using argon plasma at 1       liter/minute and 30 watts was successful.      The examined duodenum was normal.      Careful exam of the distal esophagus and retroflex view of cardia and       GEJ showed no cancer at this time. Impression:        - Grade II esophageal varices.                    - Portal hypertensive gastropathy. Treated with argon                     plasma coagulation (APC).                    -  Normal examined duodenum.                    - No specimens collected. Recommendation:    - The findings and recommendations were discussed with the  patient's family. Manya Silvas, MD 02/11/2015 10:14:32 AM This report has been signed electronically. Number of Addenda: 0 Note Initiated On: 02/11/2015 9:47 AM      [facility]

## 2015-02-11 NOTE — Anesthesia Preprocedure Evaluation (Signed)
Anesthesia Evaluation  Patient identified by MRN, date of birth, ID band Patient awake    Reviewed: Allergy & Precautions, H&P , NPO status , Patient's Chart, lab work & pertinent test results, reviewed documented beta blocker date and time   History of Anesthesia Complications Negative for: history of anesthetic complications  Airway Mallampati: III  TM Distance: >3 FB Neck ROM: full    Dental no notable dental hx. (+) Teeth Intact   Pulmonary neg pulmonary ROS,  breath sounds clear to auscultation  Pulmonary exam normal       Cardiovascular Exercise Tolerance: Good - angina+ CAD and + CABG - Past MI and - Cardiac Stents negative cardio ROS Normal cardiovascular exam- dysrhythmias - Valvular Problems/MurmursRhythm:regular Rate:Normal     Neuro/Psych negative neurological ROS  negative psych ROS   GI/Hepatic GERD-  Medicated and Controlled,(+) Cirrhosis -    substance abuse  ,   Endo/Other  diabetes, Oral Hypoglycemic AgentsHypothyroidism   Renal/GU negative Renal ROS  negative genitourinary   Musculoskeletal   Abdominal   Peds  Hematology negative hematology ROS (+)   Anesthesia Other Findings Past Medical History:   Cancer                                                         Comment:adenocarcinoma fo the GE Junction   GERD (gastroesophageal reflux disease)                       Diabetes mellitus without complication                       Thyroid disease                                                Comment:hypothroidism   CAD (coronary artery disease)                                S/P CABG (coronary artery bypass graft)                      Hyperlipidemia                                               Reproductive/Obstetrics negative OB ROS                             Anesthesia Physical Anesthesia Plan  ASA: III  Anesthesia Plan: General   Post-op Pain Management:     Induction:   Airway Management Planned:   Additional Equipment:   Intra-op Plan:   Post-operative Plan:   Informed Consent: I have reviewed the patients History and Physical, chart, labs and discussed the procedure including the risks, benefits and alternatives for the proposed anesthesia with the patient or authorized representative who has indicated his/her understanding and acceptance.   Dental Advisory Given  Plan Discussed with: Anesthesiologist, CRNA and Surgeon  Anesthesia  Plan Comments:         Anesthesia Quick Evaluation

## 2015-03-11 ENCOUNTER — Inpatient Hospital Stay: Payer: Medicare Other | Admitting: Oncology

## 2015-03-11 ENCOUNTER — Inpatient Hospital Stay: Payer: Medicare Other

## 2015-03-30 ENCOUNTER — Inpatient Hospital Stay: Payer: Medicare Other | Attending: Oncology

## 2015-03-30 ENCOUNTER — Inpatient Hospital Stay: Payer: Medicare Other

## 2015-03-30 ENCOUNTER — Inpatient Hospital Stay (HOSPITAL_BASED_OUTPATIENT_CLINIC_OR_DEPARTMENT_OTHER): Payer: Medicare Other | Admitting: Oncology

## 2015-03-30 VITALS — BP 159/73 | HR 76 | Temp 98.0°F | Ht 67.0 in | Wt 202.7 lb

## 2015-03-30 DIAGNOSIS — K219 Gastro-esophageal reflux disease without esophagitis: Secondary | ICD-10-CM | POA: Insufficient documentation

## 2015-03-30 DIAGNOSIS — Z8501 Personal history of malignant neoplasm of esophagus: Secondary | ICD-10-CM

## 2015-03-30 DIAGNOSIS — D696 Thrombocytopenia, unspecified: Secondary | ICD-10-CM

## 2015-03-30 DIAGNOSIS — D649 Anemia, unspecified: Secondary | ICD-10-CM

## 2015-03-30 DIAGNOSIS — Z9221 Personal history of antineoplastic chemotherapy: Secondary | ICD-10-CM | POA: Insufficient documentation

## 2015-03-30 DIAGNOSIS — D72819 Decreased white blood cell count, unspecified: Secondary | ICD-10-CM | POA: Insufficient documentation

## 2015-03-30 DIAGNOSIS — Z923 Personal history of irradiation: Secondary | ICD-10-CM | POA: Insufficient documentation

## 2015-03-30 DIAGNOSIS — I2510 Atherosclerotic heart disease of native coronary artery without angina pectoris: Secondary | ICD-10-CM | POA: Diagnosis not present

## 2015-03-30 DIAGNOSIS — C159 Malignant neoplasm of esophagus, unspecified: Secondary | ICD-10-CM

## 2015-03-30 DIAGNOSIS — D61818 Other pancytopenia: Secondary | ICD-10-CM

## 2015-03-30 DIAGNOSIS — E785 Hyperlipidemia, unspecified: Secondary | ICD-10-CM | POA: Diagnosis not present

## 2015-03-30 DIAGNOSIS — Z452 Encounter for adjustment and management of vascular access device: Secondary | ICD-10-CM | POA: Diagnosis not present

## 2015-03-30 DIAGNOSIS — C155 Malignant neoplasm of lower third of esophagus: Secondary | ICD-10-CM

## 2015-03-30 DIAGNOSIS — Z79899 Other long term (current) drug therapy: Secondary | ICD-10-CM | POA: Diagnosis not present

## 2015-03-30 DIAGNOSIS — E039 Hypothyroidism, unspecified: Secondary | ICD-10-CM | POA: Diagnosis not present

## 2015-03-30 DIAGNOSIS — Z951 Presence of aortocoronary bypass graft: Secondary | ICD-10-CM | POA: Diagnosis not present

## 2015-03-30 DIAGNOSIS — Z7951 Long term (current) use of inhaled steroids: Secondary | ICD-10-CM

## 2015-03-30 DIAGNOSIS — E119 Type 2 diabetes mellitus without complications: Secondary | ICD-10-CM | POA: Insufficient documentation

## 2015-03-30 DIAGNOSIS — C801 Malignant (primary) neoplasm, unspecified: Secondary | ICD-10-CM

## 2015-03-30 LAB — CBC WITH DIFFERENTIAL/PLATELET
BASOS PCT: 1 %
Basophils Absolute: 0 10*3/uL (ref 0–0.1)
EOS ABS: 0.1 10*3/uL (ref 0–0.7)
EOS PCT: 3 %
HCT: 35.3 % — ABNORMAL LOW (ref 40.0–52.0)
Hemoglobin: 11.6 g/dL — ABNORMAL LOW (ref 13.0–18.0)
LYMPHS ABS: 0.5 10*3/uL — AB (ref 1.0–3.6)
Lymphocytes Relative: 20 %
MCH: 28.5 pg (ref 26.0–34.0)
MCHC: 32.8 g/dL (ref 32.0–36.0)
MCV: 86.8 fL (ref 80.0–100.0)
MONOS PCT: 8 %
Monocytes Absolute: 0.2 10*3/uL (ref 0.2–1.0)
Neutro Abs: 1.6 10*3/uL (ref 1.4–6.5)
Neutrophils Relative %: 68 %
PLATELETS: 68 10*3/uL — AB (ref 150–440)
RBC: 4.06 MIL/uL — AB (ref 4.40–5.90)
RDW: 20.3 % — ABNORMAL HIGH (ref 11.5–14.5)
WBC: 2.3 10*3/uL — AB (ref 3.8–10.6)

## 2015-03-30 LAB — TSH: TSH: 2.331 u[IU]/mL (ref 0.350–4.500)

## 2015-03-30 MED ORDER — SODIUM CHLORIDE 0.9 % IJ SOLN
10.0000 mL | INTRAMUSCULAR | Status: DC | PRN
  Administered 2015-03-30: 10 mL via INTRAVENOUS
  Filled 2015-03-30: qty 10

## 2015-03-30 MED ORDER — HEPARIN SOD (PORK) LOCK FLUSH 100 UNIT/ML IV SOLN
500.0000 [IU] | Freq: Once | INTRAVENOUS | Status: AC
  Administered 2015-03-30: 500 [IU] via INTRAVENOUS

## 2015-03-30 NOTE — Progress Notes (Signed)
Patient here for follow up no complaints at this time.

## 2015-03-31 LAB — T4: T4 TOTAL: 7.7 ug/dL (ref 4.5–12.0)

## 2015-04-11 NOTE — Progress Notes (Signed)
[place]  [phone] [phone]  ID: Beatris Si OB: Dec 17, 1945  MR#: [id number]  ALP#:[id number]  Patient Care Team: Maryland Pink, MD as PCP - General (Family Medicine) Manya Silvas, MD (Gastroenterology)  CHIEF COMPLAINT:  Chief Complaint  Patient presents with  . Esophageal Cancer    follow up    INTERVAL HISTORY: Patient returns to clinic today for routine evaluation and laboratory work. He continues to feel well and is asymptomatic. He has no neurologic complaints. He denies any fevers or illnesses. He denies any weight loss. He has no chest pain or shortness of breath. He denies any constipation or diarrhea. He has no melena or hematochezia. He denies any pain. He has no urinary complaints. Patient offers no specific complaints today.   REVIEW OF SYSTEMS:   Review of Systems  Constitutional: Negative.   HENT: Negative.   Respiratory: Negative.   Cardiovascular: Negative.   Gastrointestinal: Negative.   Endo/Heme/Allergies: Does not bruise/bleed easily.    As per HPI. Otherwise, a complete review of systems is negatve.  PAST MEDICAL HISTORY: Past Medical History  Diagnosis Date  . Cancer     adenocarcinoma fo the GE Junction  . GERD (gastroesophageal reflux disease)   . Diabetes mellitus without complication   . Thyroid disease     hypothroidism  . CAD (coronary artery disease)   . S/P CABG (coronary artery bypass graft)   . Hyperlipidemia     PAST SURGICAL HISTORY: Past Surgical History  Procedure Laterality Date  . Coronary artery bypass graft    . Esophagogastroduodenoscopy (egd) with propofol N/A 02/11/2015    Procedure: ESOPHAGOGASTRODUODENOSCOPY (EGD) WITH PROPOFOL;  Surgeon: Manya Silvas, MD;  Location: [facility];  Service: Endoscopy;  Laterality: N/A;    FAMILY HISTORY No family history on file.     ADVANCED DIRECTIVES:    HEALTH MAINTENANCE: Social History  Substance Use Topics  .  Smoking status: Never Smoker   . Smokeless tobacco: Never Used  . Alcohol Use: No     Colonoscopy:  PAP:  Bone density:  Lipid panel:  Allergies  Allergen Reactions  . Shellfish Allergy Other (See Comments)    GOUT  . Tylenol [Acetaminophen] Other (See Comments)    Not allergic, but was told not to take it any more due to his liver condition.     Current Outpatient Prescriptions  Medication Sig Dispense Refill  . allopurinol (ZYLOPRIM) 300 MG tablet Take 300 mg by mouth daily. Take in the morning    . glipiZIDE (GLUCOTROL XL) 10 MG 24 hr tablet Take 10 mg by mouth daily with breakfast.    . levothyroxine (SYNTHROID, LEVOTHROID) 100 MCG tablet Take 100 mcg by mouth daily before breakfast.     . metFORMIN (GLUCOPHAGE) 1000 MG tablet Take 1,000 mg by mouth 2 (two) times daily with a meal.    . simvastatin (ZOCOR) 40 MG tablet Take 40 mg by mouth daily. Take at bedtime    . furosemide (LASIX) 20 MG tablet Take 20 mg by mouth daily.    . hydrocortisone (ANUSOL-HC) 25 MG suppository Place 1 suppository (25 mg total) rectally every 12 (twelve) hours. (Patient not taking: Reported on 03/30/2015) 12 suppository 1   No current facility-administered medications for this visit.   Facility-Administered Medications Ordered in Other Visits  Medication Dose Route Frequency Provider Last Rate Last Dose  . sodium chloride 0.9 % injection 10 mL  10 mL Intravenous PRN Lloyd Huger, MD  10 mL at 11/11/14 1045    OBJECTIVE: Filed Vitals:   03/30/15 1201  BP: 159/73  Pulse: 76  Temp: 98 F (36.7 C)     Body mass index is 31.74 kg/(m^2).    ECOG FS:0 - Asymptomatic  General: Well-developed, well-nourished, no acute distress. Eyes: anicteric sclera. Neck: No palpable lymphadenopathy.  Lungs: Clear to auscultation bilaterally. Heart: Regular rate and rhythm. No rubs, murmurs, or gallops. Abdomen: Soft, nontender, nondistended. No organomegaly noted, normoactive bowel  sounds. Musculoskeletal: No edema, cyanosis, or clubbing. Neuro: Alert, answering all questions appropriately. Cranial nerves grossly intact. Skin: No rashes or petechiae noted. Psych: Normal affect.   LAB RESULTS:  Lab Results  Component Value Date   NA 135 12/09/2014   K 3.8 12/09/2014   CL 104 12/09/2014   CO2 26 12/09/2014   GLUCOSE 145* 12/09/2014   BUN 8 12/09/2014   CREATININE 0.72 12/09/2014   CALCIUM 9.2 12/09/2014   PROT 7.0 12/09/2014   ALBUMIN 3.9 12/09/2014   AST 40 12/09/2014   ALT 21 12/09/2014   ALKPHOS 79 12/09/2014   BILITOT 1.0 12/09/2014   GFRNONAA >60 12/09/2014   GFRAA >60 12/09/2014    Lab Results  Component Value Date   WBC 2.3* 03/30/2015   NEUTROABS 1.6 03/30/2015   HGB 11.6* 03/30/2015   HCT 35.3* 03/30/2015   MCV 86.8 03/30/2015   PLT 68* 03/30/2015     STUDIES: No results found.  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  PLAN:    1. Esophageal adenocarcinoma: Patient completed his chemotherapy and concurrent XRT on October 09, 2014. PET scan results from December 02, 2014 reviewed independently and reported no evidence of residual disease.  No further intervention is needed at this time. Patient had an EGD on February 11, 2015 that was reported as negative. Because of patient's history CABG, he is refusing any additional chest surgery.  Return to clinic in 3 months with laboratory work and further evaluation.  Will reimage at that time. 2. Thrombocytopenia: Stable. Likely multifactorial. Bone marrow biopsy was discussed with patient, but he is refusing this procedure at this time. Monitor.  3. Anemia: Hemoglobin decreased, but stable. Monitor.   4. Leukopenia: Patient refusing bone marrow biopsy as above. Monitor.  Patient expressed understanding and was in agreement with this plan. He also understands that He can call clinic at any time with any questions, concerns, or complaints.    Lloyd Huger, MD   04/11/2015 9:35  PM

## 2015-04-12 ENCOUNTER — Ambulatory Visit
Admission: RE | Admit: 2015-04-12 | Discharge: 2015-04-12 | Disposition: A | Discharge status: Home / Self Care | Payer: Medicare Other | Source: Ambulatory Visit | Attending: Radiation Oncology | Admitting: Radiation Oncology

## 2015-04-12 ENCOUNTER — Encounter: Payer: Self-pay | Admitting: Radiation Oncology

## 2015-04-12 VITALS — BP 152/65 | HR 81 | Temp 97.4°F | Resp 18 | Wt 203.8 lb

## 2015-04-12 DIAGNOSIS — C155 Malignant neoplasm of lower third of esophagus: Secondary | ICD-10-CM

## 2015-04-12 NOTE — Progress Notes (Signed)
Radiation Oncology Follow up Note  Name: [facility]   Date:   04/12/2015 MRN:  [id number] DOB: 05-Oct-1945    This 69 y.o. male presents to the clinic today for follow-up of esophageal cancer stage IB moderately differentiated adenocarcinoma the GE junction.  REFERRING PROVIDER: Maryland Pink, MD  HPI: Patient is a 69 year old male now seen out 5 months having completed concurrent chemotherapy and radiation therapy for moderately differentiated adenocarcinoma the GE junction stage IB (T2 NX M0). He is seen today in routine follow up is doing well having no dysphagia whatsoever his weight is stable actually increased. He had a. PET CT back in June showing no evidence to suggest persistent or metastatic disease. He is scheduled for follow-up up for endoscopy and CT scan of the next several months.  COMPLICATIONS OF TREATMENT: none  FOLLOW UP COMPLIANCE: keeps appointments   PHYSICAL EXAM:  BP 152/65 mmHg  Pulse 81  Temp(Src) 97.4 F (36.3 C)  Resp 18  Wt 203 lb 13 oz (92.45 kg) Well-developed well-nourished patient in NAD. HEENT reveals PERLA, EOMI, discs not visualized.  Oral cavity is clear. No oral mucosal lesions are identified. Neck is clear without evidence of cervical or supraclavicular adenopathy. Lungs are clear to A&P. Cardiac examination is essentially unremarkable with regular rate and rhythm without murmur rub or thrill. Abdomen is benign with no organomegaly or masses noted. Motor sensory and DTR levels are equal and symmetric in the upper and lower extremities. Cranial nerves II through XII are grossly intact. Proprioception is intact. No peripheral adenopathy or edema is identified. No motor or sensory levels are noted. Crude visual fields are within normal range.  RADIOLOGY RESULTS: Previous PET CT scan is reviewed and compatible with the above-stated findings  PLAN: At the present time both clinically and from a PET/CT point of view he is doing well with no evidence  of disease. I am please was overall progress. I've asked to see him back in 6 months for follow-up. I will review his upper endoscopy and new CT scans when they become available. Patient knows to call sooner with any concerns.  I would like to take this opportunity for allowing me to participate in the care of your patient.Armstead Peaks., MD

## 2015-05-11 ENCOUNTER — Inpatient Hospital Stay: Payer: Medicare Other | Attending: Oncology

## 2015-05-11 DIAGNOSIS — Z9221 Personal history of antineoplastic chemotherapy: Secondary | ICD-10-CM | POA: Insufficient documentation

## 2015-05-11 DIAGNOSIS — Z923 Personal history of irradiation: Secondary | ICD-10-CM | POA: Diagnosis not present

## 2015-05-11 DIAGNOSIS — Z8501 Personal history of malignant neoplasm of esophagus: Secondary | ICD-10-CM | POA: Diagnosis present

## 2015-05-11 DIAGNOSIS — C155 Malignant neoplasm of lower third of esophagus: Secondary | ICD-10-CM

## 2015-05-11 MED ORDER — SODIUM CHLORIDE 0.9 % IJ SOLN
10.0000 mL | INTRAMUSCULAR | Status: AC | PRN
Start: 2015-05-11 — End: ?
  Administered 2015-05-11: 10 mL via INTRAVENOUS
  Filled 2015-05-11: qty 10

## 2015-05-11 MED ORDER — HEPARIN SOD (PORK) LOCK FLUSH 100 UNIT/ML IV SOLN
500.0000 [IU] | Freq: Once | INTRAVENOUS | Status: AC
  Administered 2015-05-11: 500 [IU] via INTRAVENOUS
  Filled 2015-05-11: qty 5

## 2015-06-09 ENCOUNTER — Encounter: Payer: Self-pay | Admitting: Medical Oncology

## 2015-06-09 ENCOUNTER — Emergency Department
Admission: EM | Admit: 2015-06-09 | Discharge: 2015-06-09 | Disposition: A | Discharge status: Home / Self Care | Payer: Medicare Other | Attending: Emergency Medicine | Admitting: Emergency Medicine

## 2015-06-09 DIAGNOSIS — Z7952 Long term (current) use of systemic steroids: Secondary | ICD-10-CM | POA: Diagnosis not present

## 2015-06-09 DIAGNOSIS — E119 Type 2 diabetes mellitus without complications: Secondary | ICD-10-CM | POA: Insufficient documentation

## 2015-06-09 DIAGNOSIS — H109 Unspecified conjunctivitis: Secondary | ICD-10-CM | POA: Insufficient documentation

## 2015-06-09 DIAGNOSIS — J0100 Acute maxillary sinusitis, unspecified: Secondary | ICD-10-CM

## 2015-06-09 DIAGNOSIS — Z79899 Other long term (current) drug therapy: Secondary | ICD-10-CM | POA: Diagnosis not present

## 2015-06-09 DIAGNOSIS — Z7984 Long term (current) use of oral hypoglycemic drugs: Secondary | ICD-10-CM | POA: Insufficient documentation

## 2015-06-09 DIAGNOSIS — Z792 Long term (current) use of antibiotics: Secondary | ICD-10-CM | POA: Insufficient documentation

## 2015-06-09 DIAGNOSIS — R51 Headache: Secondary | ICD-10-CM | POA: Diagnosis present

## 2015-06-09 MED ORDER — AMOXICILLIN-POT CLAVULANATE 875-125 MG PO TABS: 1.0000 | ORAL_TABLET | Freq: Two times a day (BID) | ORAL | Status: AC

## 2015-06-09 MED ORDER — DIPHENHYDRAMINE HCL 25 MG PO CAPS
25.0000 mg | ORAL_CAPSULE | Freq: Once | ORAL | Status: AC
  Administered 2015-06-09: 25 mg via ORAL
  Filled 2015-06-09: qty 1

## 2015-06-09 MED ORDER — TOBRAMYCIN 0.3 % OP SOLN: 2.0000 [drp] | OPHTHALMIC | Status: DC

## 2015-06-09 NOTE — ED Notes (Signed)
Pt to triage with reports of facial swelling, redness and burning that began yesterday. Pt denies itching. Rash only to face, denies sob.

## 2015-06-09 NOTE — ED Provider Notes (Signed)
[facility] Provider Note ____________________________________________  Time seen: Approximately 9:18 AM  I have reviewed the triage vital signs and the nursing notes.   HISTORY  Chief Complaint Allergic Reaction  HPI Mark [facility] is a 69 y.o. male is here with complaint of facial pain. Patient states that he also woke up with some periorbital swelling and redness mostly on the right side.[facility] He states that it does not itch and he has had no difficulty with breathing. He denies any previous events like this. Wife states that he has been coughing a lot. Patient states he has had chills but is not aware of any fever.Patient is very poor historian and wife is giving most of the information with patient agreeing.    Past Medical History  Diagnosis Date  . Cancer [facility])     adenocarcinoma fo the GE Junction  . GERD (gastroesophageal reflux disease)   . Diabetes mellitus without complication ([place])   . Thyroid disease     hypothroidism  . CAD (coronary artery disease)   . S/P CABG (coronary artery bypass graft)   . Hyperlipidemia     Patient Active Problem List   Diagnosis Date Noted  . Esophageal cancer ([place]) 11/11/2014    Past Surgical History  Procedure Laterality Date  . Coronary artery bypass graft    . Esophagogastroduodenoscopy (egd) with propofol N/A 02/11/2015    Procedure: ESOPHAGOGASTRODUODENOSCOPY (EGD) WITH PROPOFOL;  Surgeon: Manya Silvas, MD;  Location: [facility];  Service: Endoscopy;  Laterality: N/A;    Current Outpatient Rx  Name  Route  Sig  Dispense  Refill  . allopurinol (ZYLOPRIM) 300 MG tablet   Oral   Take 300 mg by mouth daily. Take in the morning         . amoxicillin-clavulanate (AUGMENTIN) 875-125 MG tablet   Oral   Take 1 tablet by mouth 2 (two) times daily.   14 tablet   0   . furosemide (LASIX) 20 MG tablet   Oral   Take 20 mg by mouth daily.         [facility] glipiZIDE (GLUCOTROL XL) 10  MG 24 hr tablet   Oral   Take 10 mg by mouth daily with breakfast.         . hydrocortisone (ANUSOL-HC) 25 MG suppository   Rectal   Place 1 suppository (25 mg total) rectally every 12 (twelve) hours.   12 suppository   1   . levothyroxine (SYNTHROID, LEVOTHROID) 100 MCG tablet   Oral   Take 100 mcg by mouth daily before breakfast.          . metFORMIN (GLUCOPHAGE) 1000 MG tablet   Oral   Take 1,000 mg by mouth 2 (two) times daily with a meal.         . simvastatin (ZOCOR) 40 MG tablet   Oral   Take 40 mg by mouth daily. Take at bedtime         . tobramycin (TOBREX) 0.3 % ophthalmic solution   Left Eye   Place 2 drops into the left eye every 4 (four) hours.   5 mL   0     Allergies Shellfish allergy and Tylenol  No family history on file.  Social History Social History  Substance Use Topics  . Smoking status: Never Smoker   . Smokeless tobacco: Never Used  . Alcohol Use: No    Review of Systems Constitutional: No fever/positive chills Eyes: No visual  changes. ENT: No sore throat. Positive facial pain Cardiovascular: Denies chest pain. Respiratory: Denies shortness of breath. Gastrointestinal: No abdominal pain.  No nausea, no vomiting.  No diarrhea.  Genitourinary: Negative for dysuria. Musculoskeletal: Negative for back pain. Skin: Negative for rash. Neurological: Negative for headaches, focal weakness or numbness.  10-point ROS otherwise negative.  ____________________________________________   PHYSICAL EXAM:  VITAL SIGNS: ED Triage Vitals  Enc Vitals Group     BP 06/09/15 0853 145/46 mmHg     Pulse Rate 06/09/15 0853 93     Resp 06/09/15 0853 18     Temp 06/09/15 0853 98.9 F (37.2 C)     Temp Source 06/09/15 0853 Oral     SpO2 06/09/15 0853 96 %     Weight 06/09/15 0853 200 lb (90.719 kg)     Height 06/09/15 0853 5\' 6"  (1.676 m)     Head Cir --      Peak Flow --      Pain Score 06/09/15 0856 6     Pain Loc --      Pain Edu? --       Excl. in [place]? --     Constitutional: Alert and oriented. Well appearing and in no acute distress. Eyes: Conjunctivae are normal at present however there is thick yellow exudate in the right eye. PERRL. EOMI. Head: Atraumatic. Moderate tenderness on percussion of the maxillary sinuses bilaterally. Nose: No congestion/rhinnorhea.  EACs and TMs are clear bilaterally. Mouth/Throat: Mucous membranes are moist.  Oropharynx non-erythematous. Posterior drainage. Neck: No stridor.   Hematological/Lymphatic/Immunilogical: No cervical lymphadenopathy. Cardiovascular: Normal rate, regular rhythm. Grossly normal heart sounds.  Good peripheral circulation. Respiratory: Normal respiratory effort.  No retractions. Lungs CTAB. Gastrointestinal: Soft and nontender. No distention.  Musculoskeletal: No lower extremity tenderness nor edema.  No joint effusions. Neurologic:  Normal speech and language. No gross focal neurologic deficits are appreciated. No gait instability. Skin:  Skin is warm, dry and intact. No rash noted. There is some facial edema right infraorbital area without abscess. Area is soft. Left infraorbital and maxillary sinus area is slightly erythematous but no warmth or tenderness bilaterally. Psychiatric: Mood and affect are normal. Speech and behavior are normal.  ____________________________________________   LABS (all labs ordered are listed, but only abnormal results are displayed)  Labs Reviewed - No data to display   PROCEDURES  Procedure(s) performed: None  Critical Care performed: No  ____________________________________________   INITIAL IMPRESSION / ASSESSMENT AND PLAN / ED COURSE  Pertinent labs & imaging results that were available during my care of the patient were reviewed by me and considered in my medical decision making (see chart for details).  She was started on Augmentin 875 one twice a day for 10 days. He is also given a prescription for tobramycin  ophthalmic solution to be used in both eyes. Patient is to follow-up with his primary care doctor in [place]  if any continued problems. ____________________________________________   FINAL CLINICAL IMPRESSION(S) / ED DIAGNOSES  Final diagnoses:  Acute maxillary sinusitis, recurrence not specified  Conjunctivitis of right eye      Johnn Hai, PA-C 06/09/15 1047  Nance Pear, MD 06/09/15 1218

## 2015-06-09 NOTE — Discharge Instructions (Signed)
Sinusitis, Adult Sinusitis is redness, soreness, and puffiness (inflammation) of the air pockets in the bones of your face (sinuses). The redness, soreness, and puffiness can cause air and mucus to get trapped in your sinuses. This can allow germs to grow and cause an infection.  HOME CARE   Drink enough fluids to keep your pee (urine) clear or pale yellow.  Use a humidifier in your home.  Run a hot shower to create steam in the bathroom. Sit in the bathroom with the door closed. Breathe in the steam 3-4 times a day.  Put a warm, moist washcloth on your face 3-4 times a day, or as told by your doctor.  Use salt water sprays (saline sprays) to wet the thick fluid in your nose. This can help the sinuses drain.  Only take medicine as told by your doctor. GET HELP RIGHT AWAY IF:   Your pain gets worse.  You have very bad headaches.  You are sick to your stomach (nauseous).  You throw up (vomit).  You are very sleepy (drowsy) all the time.  Your face is puffy (swollen).  Your vision changes.  You have a stiff neck.  You have trouble breathing. MAKE SURE YOU:   Understand these instructions.  Will watch your condition.  Will get help right away if you are not doing well or get worse.   This information is not intended to replace advice given to you by your health care provider. Make sure you discuss any questions you have with your health care provider.   Document Released: 11/15/2007 Document Revised: 06/19/2014 Document Reviewed: 01/02/2012 Elsevier Interactive Patient Education 2016 [place] with Dr. Kary Kos if any continued problems. You may begin taking Benadryl one capsule every 6 hours. Augmentin 875 one tablet twice a day for 10 days. Increase fluids.

## 2015-06-09 NOTE — ED Notes (Signed)
Developed some redness under both eyes yesterday  Woke up with increased periorbital swelling and redness   No itching or diff breathing

## 2015-06-22 ENCOUNTER — Inpatient Hospital Stay: Payer: Medicare Other | Attending: Oncology

## 2015-06-22 ENCOUNTER — Inpatient Hospital Stay
Admission: EM | Admit: 2015-06-22 | Discharge: 2015-06-25 | DRG: 441 | Disposition: A | Discharge status: Home / Self Care | Payer: Medicare Other | Attending: Internal Medicine | Admitting: Internal Medicine

## 2015-06-22 DIAGNOSIS — D649 Anemia, unspecified: Secondary | ICD-10-CM | POA: Insufficient documentation

## 2015-06-22 DIAGNOSIS — K766 Portal hypertension: Principal | ICD-10-CM | POA: Diagnosis present

## 2015-06-22 DIAGNOSIS — Z9221 Personal history of antineoplastic chemotherapy: Secondary | ICD-10-CM | POA: Diagnosis not present

## 2015-06-22 DIAGNOSIS — I2510 Atherosclerotic heart disease of native coronary artery without angina pectoris: Secondary | ICD-10-CM | POA: Diagnosis present

## 2015-06-22 DIAGNOSIS — I8500 Esophageal varices without bleeding: Secondary | ICD-10-CM | POA: Insufficient documentation

## 2015-06-22 DIAGNOSIS — R5381 Other malaise: Secondary | ICD-10-CM | POA: Insufficient documentation

## 2015-06-22 DIAGNOSIS — K7469 Other cirrhosis of liver: Secondary | ICD-10-CM | POA: Diagnosis present

## 2015-06-22 DIAGNOSIS — Z923 Personal history of irradiation: Secondary | ICD-10-CM | POA: Diagnosis not present

## 2015-06-22 DIAGNOSIS — E785 Hyperlipidemia, unspecified: Secondary | ICD-10-CM | POA: Insufficient documentation

## 2015-06-22 DIAGNOSIS — M109 Gout, unspecified: Secondary | ICD-10-CM | POA: Diagnosis present

## 2015-06-22 DIAGNOSIS — R5383 Other fatigue: Secondary | ICD-10-CM | POA: Insufficient documentation

## 2015-06-22 DIAGNOSIS — Z951 Presence of aortocoronary bypass graft: Secondary | ICD-10-CM | POA: Insufficient documentation

## 2015-06-22 DIAGNOSIS — R600 Localized edema: Secondary | ICD-10-CM | POA: Insufficient documentation

## 2015-06-22 DIAGNOSIS — Z8719 Personal history of other diseases of the digestive system: Secondary | ICD-10-CM | POA: Insufficient documentation

## 2015-06-22 DIAGNOSIS — K922 Gastrointestinal hemorrhage, unspecified: Secondary | ICD-10-CM | POA: Diagnosis present

## 2015-06-22 DIAGNOSIS — I248 Other forms of acute ischemic heart disease: Secondary | ICD-10-CM | POA: Diagnosis present

## 2015-06-22 DIAGNOSIS — D72819 Decreased white blood cell count, unspecified: Secondary | ICD-10-CM | POA: Insufficient documentation

## 2015-06-22 DIAGNOSIS — Z91013 Allergy to seafood: Secondary | ICD-10-CM

## 2015-06-22 DIAGNOSIS — K219 Gastro-esophageal reflux disease without esophagitis: Secondary | ICD-10-CM | POA: Insufficient documentation

## 2015-06-22 DIAGNOSIS — K7460 Unspecified cirrhosis of liver: Secondary | ICD-10-CM | POA: Insufficient documentation

## 2015-06-22 DIAGNOSIS — Z7984 Long term (current) use of oral hypoglycemic drugs: Secondary | ICD-10-CM

## 2015-06-22 DIAGNOSIS — K3189 Other diseases of stomach and duodenum: Secondary | ICD-10-CM | POA: Diagnosis present

## 2015-06-22 DIAGNOSIS — E039 Hypothyroidism, unspecified: Secondary | ICD-10-CM | POA: Insufficient documentation

## 2015-06-22 DIAGNOSIS — E119 Type 2 diabetes mellitus without complications: Secondary | ICD-10-CM | POA: Diagnosis present

## 2015-06-22 DIAGNOSIS — Z8501 Personal history of malignant neoplasm of esophagus: Secondary | ICD-10-CM | POA: Insufficient documentation

## 2015-06-22 DIAGNOSIS — Z85028 Personal history of other malignant neoplasm of stomach: Secondary | ICD-10-CM

## 2015-06-22 DIAGNOSIS — Z79899 Other long term (current) drug therapy: Secondary | ICD-10-CM | POA: Insufficient documentation

## 2015-06-22 DIAGNOSIS — I8511 Secondary esophageal varices with bleeding: Secondary | ICD-10-CM | POA: Diagnosis present

## 2015-06-22 DIAGNOSIS — D62 Acute posthemorrhagic anemia: Secondary | ICD-10-CM | POA: Diagnosis present

## 2015-06-22 DIAGNOSIS — Z886 Allergy status to analgesic agent status: Secondary | ICD-10-CM | POA: Diagnosis not present

## 2015-06-22 DIAGNOSIS — D696 Thrombocytopenia, unspecified: Secondary | ICD-10-CM | POA: Insufficient documentation

## 2015-06-22 DIAGNOSIS — K921 Melena: Secondary | ICD-10-CM

## 2015-06-22 DIAGNOSIS — R531 Weakness: Secondary | ICD-10-CM | POA: Insufficient documentation

## 2015-06-22 HISTORY — DX: Esophageal varices without bleeding: I85.00

## 2015-06-22 HISTORY — DX: Other cirrhosis of liver: K74.69

## 2015-06-22 LAB — COMPREHENSIVE METABOLIC PANEL
ALT: 16 U/L — AB (ref 17–63)
AST: 29 U/L (ref 15–41)
Albumin: 3.6 g/dL (ref 3.5–5.0)
Alkaline Phosphatase: 42 U/L (ref 38–126)
Anion gap: 10 (ref 5–15)
BILIRUBIN TOTAL: 0.9 mg/dL (ref 0.3–1.2)
BUN: 36 mg/dL — ABNORMAL HIGH (ref 6–20)
CALCIUM: 9.2 mg/dL (ref 8.9–10.3)
CHLORIDE: 110 mmol/L (ref 101–111)
CO2: 23 mmol/L (ref 22–32)
CREATININE: 0.82 mg/dL (ref 0.61–1.24)
Glucose, Bld: 236 mg/dL — ABNORMAL HIGH (ref 65–99)
Potassium: 3.8 mmol/L (ref 3.5–5.1)
Sodium: 143 mmol/L (ref 135–145)
TOTAL PROTEIN: 5.9 g/dL — AB (ref 6.5–8.1)

## 2015-06-22 LAB — MRSA PCR SCREENING: MRSA BY PCR: NEGATIVE

## 2015-06-22 LAB — TROPONIN I
TROPONIN I: 0.64 ng/mL — AB (ref <0.031)
TROPONIN I: 1.09 ng/mL — AB (ref <0.031)
Troponin I: 0.71 ng/mL — ABNORMAL HIGH (ref <0.031)

## 2015-06-22 LAB — CBC
HCT: 20.3 % — ABNORMAL LOW (ref 40.0–52.0)
Hemoglobin: 6.3 g/dL — ABNORMAL LOW (ref 13.0–18.0)
MCH: 25.1 pg — ABNORMAL LOW (ref 26.0–34.0)
MCHC: 31.1 g/dL — ABNORMAL LOW (ref 32.0–36.0)
MCV: 80.7 fL (ref 80.0–100.0)
PLATELETS: 100 10*3/uL — AB (ref 150–440)
RBC: 2.52 MIL/uL — AB (ref 4.40–5.90)
RDW: 18.7 % — AB (ref 11.5–14.5)
WBC: 3.9 10*3/uL (ref 3.8–10.6)

## 2015-06-22 LAB — GLUCOSE, CAPILLARY
Glucose-Capillary: 92 mg/dL (ref 65–99)
Glucose-Capillary: 116 mg/dL — ABNORMAL HIGH (ref 65–99)

## 2015-06-22 LAB — APTT: APTT: 34 s (ref 24–36)

## 2015-06-22 LAB — PREPARE RBC (CROSSMATCH)

## 2015-06-22 LAB — HEMOGLOBIN: HEMOGLOBIN: 7.2 g/dL — AB (ref 13.0–18.0)

## 2015-06-22 LAB — PROTIME-INR
INR: 1.58
PROTHROMBIN TIME: 18.9 s — AB (ref 11.4–15.0)

## 2015-06-22 LAB — ABO/RH: ABO/RH(D): O POS

## 2015-06-22 MED ORDER — SODIUM CHLORIDE 0.9 % IV SOLN
10.0000 mL/h | Freq: Once | INTRAVENOUS | Status: AC
  Administered 2015-06-22: 10 mL/h via INTRAVENOUS

## 2015-06-22 MED ORDER — SODIUM CHLORIDE 0.9 % IV SOLN
80.0000 mg | Freq: Once | INTRAVENOUS | Status: DC
  Filled 2015-06-22: qty 80

## 2015-06-22 MED ORDER — MORPHINE SULFATE (PF) 2 MG/ML IV SOLN: 2.0000 mg | INTRAVENOUS | Status: DC | PRN

## 2015-06-22 MED ORDER — OCTREOTIDE LOAD VIA INFUSION
50.0000 ug | Freq: Once | INTRAVENOUS | Status: DC
  Filled 2015-06-22: qty 25

## 2015-06-22 MED ORDER — SODIUM CHLORIDE 0.9 % IJ SOLN
3.0000 mL | Freq: Two times a day (BID) | INTRAMUSCULAR | Status: DC
  Administered 2015-06-22 – 2015-06-25 (×7): 3 mL via INTRAVENOUS

## 2015-06-22 MED ORDER — ONDANSETRON HCL 4 MG/2ML IJ SOLN
4.0000 mg | Freq: Four times a day (QID) | INTRAMUSCULAR | Status: DC | PRN
Start: 2015-06-22 — End: 2015-06-25
  Administered 2015-06-23: 4 mg via INTRAVENOUS
  Filled 2015-06-22: qty 2

## 2015-06-22 MED ORDER — ALBUTEROL SULFATE (2.5 MG/3ML) 0.083% IN NEBU: 2.5000 mg | INHALATION_SOLUTION | RESPIRATORY_TRACT | Status: DC | PRN

## 2015-06-22 MED ORDER — SODIUM CHLORIDE 0.9 % IV SOLN
Freq: Once | INTRAVENOUS | Status: AC
  Administered 2015-06-22: via INTRAVENOUS

## 2015-06-22 MED ORDER — FLEET ENEMA 7-19 GM/118ML RE ENEM: 1.0000 | ENEMA | Freq: Once | RECTAL | Status: DC | PRN

## 2015-06-22 MED ORDER — INSULIN ASPART 100 UNIT/ML ~~LOC~~ SOLN
0.0000 [IU] | SUBCUTANEOUS | Status: DC
  Administered 2015-06-23: 2 [IU] via SUBCUTANEOUS
  Administered 2015-06-24 – 2015-06-25 (×2): 1 [IU] via SUBCUTANEOUS
  Filled 2015-06-22: qty 2
  Filled 2015-06-22 (×2): qty 1

## 2015-06-22 MED ORDER — SODIUM CHLORIDE 0.9 % IV SOLN
50.0000 ug/h | INTRAVENOUS | Status: DC
  Administered 2015-06-22 – 2015-06-24 (×5): 50 ug/h via INTRAVENOUS
  Filled 2015-06-22 (×8): qty 1

## 2015-06-22 MED ORDER — ONDANSETRON HCL 4 MG PO TABS
4.0000 mg | ORAL_TABLET | Freq: Four times a day (QID) | ORAL | Status: DC | PRN
Start: 2015-06-22 — End: 2015-06-25

## 2015-06-22 MED ORDER — POTASSIUM CHLORIDE IN NACL 20-0.9 MEQ/L-% IV SOLN
INTRAVENOUS | Status: DC
  Administered 2015-06-22 – 2015-06-23 (×2): via INTRAVENOUS
  Filled 2015-06-22 (×3): qty 1000

## 2015-06-22 MED ORDER — SODIUM CHLORIDE 0.9 % IV SOLN
8.0000 mg/h | INTRAVENOUS | Status: DC
  Administered 2015-06-22 – 2015-06-23 (×3): 8 mg/h via INTRAVENOUS
  Filled 2015-06-22 (×3): qty 80

## 2015-06-22 MED ORDER — DEXTROSE 5 % IV SOLN
1.0000 g | INTRAVENOUS | Status: DC
  Administered 2015-06-22: 1 g via INTRAVENOUS
  Filled 2015-06-22 (×2): qty 10

## 2015-06-22 MED ORDER — CETYLPYRIDINIUM CHLORIDE 0.05 % MT LIQD
7.0000 mL | Freq: Two times a day (BID) | OROMUCOSAL | Status: DC
  Administered 2015-06-23 – 2015-06-24 (×4): 7 mL via OROMUCOSAL

## 2015-06-22 NOTE — ED Provider Notes (Signed)
[facility] Emergency Department Provider Note  ____________________________________________  Time seen: Approximately 12:15 PM  I have reviewed the triage vital signs and the nursing notes.   HISTORY  Chief Complaint GI Bleeding    HPI [facility] is a 70 y.o. male whose past medical history includes adenocarcinoma of the GE junction status post chemotherapy and radiation as well as CAD status post CABG and uncomplicated diabetes who presents with 2 days of black and tarry stools.  He says that it started yesterday and he has had 6-8 bowel movements that have all been black, thick, and concerning for blood.  He states that he also feels weak and he is having intermittent chest pressure, particularly when he is having a bowel movement.  Overall he describes his symptoms as severe.  He denies shortness of breath, fever/chills, abdominal pain or cramping.  He also denies nausea or vomiting.   Past Medical History  Diagnosis Date  . Cancer [facility])     adenocarcinoma fo the GE Junction  . GERD (gastroesophageal reflux disease)   . Diabetes mellitus without complication ([place])   . Thyroid disease     hypothroidism  . CAD (coronary artery disease)   . S/P CABG (coronary artery bypass graft)   . Hyperlipidemia     Patient Active Problem List   Diagnosis Date Noted  . GI bleed 06/22/2015  . Esophageal cancer ([place]) 11/11/2014    Past Surgical History  Procedure Laterality Date  . Coronary artery bypass graft    . Esophagogastroduodenoscopy (egd) with propofol N/A 02/11/2015    Procedure: ESOPHAGOGASTRODUODENOSCOPY (EGD) WITH PROPOFOL;  Surgeon: Manya Silvas, MD;  Location: [place];  Service: Endoscopy;  Laterality: N/A;    Current Outpatient Rx  Name  Route  Sig  Dispense  Refill  . allopurinol (ZYLOPRIM) 300 MG tablet   Oral   Take 300 mg by mouth daily. Take in the morning         . furosemide (LASIX) 20 MG tablet   Oral   Take 20 mg  by mouth daily.         [name] glipiZIDE (GLUCOTROL XL) 10 MG 24 hr tablet   Oral   Take 10 mg by mouth daily with breakfast.         . hydrocortisone (ANUSOL-HC) 25 MG suppository   Rectal   Place 1 suppository (25 mg total) rectally every 12 (twelve) hours.   12 suppository   1   . levothyroxine (SYNTHROID, LEVOTHROID) 100 MCG tablet   Oral   Take 100 mcg by mouth daily before breakfast.          . metFORMIN (GLUCOPHAGE) 1000 MG tablet   Oral   Take 1,000 mg by mouth 2 (two) times daily with a meal.         . simvastatin (ZOCOR) 40 MG tablet   Oral   Take 40 mg by mouth daily. Take at bedtime         . tobramycin (TOBREX) 0.3 % ophthalmic solution   Left Eye   Place 2 drops into the left eye every 4 (four) hours.   5 mL   0     Allergies Shellfish allergy and Tylenol  No family history on file.  Social History Social History  Substance Use Topics  . Smoking status: Never Smoker   . Smokeless tobacco: Never Used  . Alcohol Use: No    Review of Systems Constitutional: No fever/chills.  Generalized  weakness and malaise. Eyes: No visual changes. ENT: No sore throat. Cardiovascular: Denies chest pain. Respiratory: Denies shortness of breath. Gastrointestinal: No abdominal pain.  No nausea, no vomiting.  6-8 episodes of black and tarry stools since yesterday. Genitourinary: Negative for dysuria. Musculoskeletal: Negative for back pain. Skin: Pruritic rash of bilateral lower extremities. Neurological: Negative for headaches, focal weakness or numbness.  10-point ROS otherwise negative.  ____________________________________________   PHYSICAL EXAM:  VITAL SIGNS: ED Triage Vitals  Enc Vitals Group     BP 06/22/15 1018 118/43 mmHg     Pulse Rate 06/22/15 1018 92     Resp 06/22/15 1018 16     Temp 06/22/15 1018 97.6 F (36.4 C)     Temp Source 06/22/15 1018 Oral     SpO2 06/22/15 1018 96 %     Weight 06/22/15 1018 200 lb (90.719 kg)     Height  06/22/15 1018 5\' 7"  (1.702 m)     Head Cir --      Peak Flow --      Pain Score 06/22/15 1018 6     Pain Loc --      Pain Edu? --      Excl. in [place]? --     Constitutional: Alert and oriented.  Appears pale and uncomfortable but in no acute distress. Eyes: Conjunctivae are normal. PERRL. EOMI. Head: Atraumatic. Nose: No congestion/rhinnorhea. Mouth/Throat: Mucous membranes are moist.  Oropharynx non-erythematous. Neck: No stridor.   Cardiovascular: Order line tachycardia, regular rhythm. Grossly normal heart sounds.  Good peripheral circulation. Respiratory: Normal respiratory effort.  No retractions. Lungs CTAB. Gastrointestinal: Soft and nontender. No distention. No abdominal bruits. No CVA tenderness. Rectal:  Nontender digital exam with melena, gross blood is present, strongly heme positive and quality control passed Musculoskeletal: No lower extremity tenderness nor edema.  No joint effusions. Neurologic:  Normal speech and language. No gross focal neurologic deficits are appreciated.  Skin:  Skin is warm, pale, dry and intact.  Mild erythema of the bilateral lower extremities, no petechiae, erythema is blanching, no pustules or bullae Psychiatric: Mood and affect are normal. Speech and behavior are normal.  ____________________________________________   LABS (all labs ordered are listed, but only abnormal results are displayed)  Labs Reviewed  COMPREHENSIVE METABOLIC PANEL - Abnormal; Notable for the following:    Glucose, Bld 236 (*)    BUN 36 (*)    Total Protein 5.9 (*)    ALT 16 (*)    All other components within normal limits  CBC - Abnormal; Notable for the following:    RBC 2.52 (*)    Hemoglobin 6.3 (*)    HCT 20.3 (*)    MCH 25.1 (*)    MCHC 31.1 (*)    RDW 18.7 (*)    Platelets 100 (*)    All other components within normal limits  PROTIME-INR - Abnormal; Notable for the following:    Prothrombin Time 18.9 (*)    All other components within normal limits   APTT  TROPONIN I  TYPE AND SCREEN  ABO/RH  PREPARE RBC (CROSSMATCH)   ____________________________________________  EKG  ED ECG REPORT I, Chirstina Haan, the attending physician, personally viewed and interpreted this ECG.   Date: 06/22/2015  EKG Time: 13:00  Rate: 87  Rhythm: normal sinus rhythm  Axis: Left axis deviation  Intervals:Nonspecific interventricular conduction delay  ST&T Change: Non-specific ST segment / T-wave changes, but no evidence of acute ischemia.  ____________________________________________  RADIOLOGY   No  results found.  ____________________________________________   PROCEDURES  Procedure(s) performed: None  Critical Care performed: No ____________________________________________   INITIAL IMPRESSION / ASSESSMENT AND PLAN / ED COURSE  Pertinent labs & imaging results that were available during my care of the patient were reviewed by me and considered in my medical decision making (see chart for details).  The patient has a history of GI cancer status post chemotherapy and radiation who has new onset melena since yesterday with generalized weakness, some chest pressure, and a hemoglobin of 6.3 which is down 5.5 points since his last hemoglobin 3 months ago.  I have ordered 2 units of blood to be transfused each over 2 hours as well as an additional 2 units backup.  The patient has 2 large bore peripheral IVs.  I am holding off on normal saline since he is hemodynamically stable at this time and I do not want to further dilute his blood.  I have consented him for blood transfusion and will talk to the hospitalist about admission.  ____________________________________________  FINAL CLINICAL IMPRESSION(S) / ED DIAGNOSES  Final diagnoses:  Gastrointestinal hemorrhage with melena  Anemia associated with acute blood loss      NEW MEDICATIONS STARTED DURING THIS VISIT:  New Prescriptions   No medications on file     Modified  Medications   No medications on file     Discontinued Medications   No medications on file     Hinda Kehr, MD 06/22/15 1311

## 2015-06-22 NOTE — H&P (Signed)
[place] [facility] [place] NAME: Mark Ford    MR#:  [id number]  DATE OF BIRTH:  Oct 29, 1945  DATE OF ADMISSION:  06/22/2015  PRIMARY CARE PHYSICIAN: Maryland Pink, MD   REQUESTING/REFERRING PHYSICIAN: Dr. Karma Greaser  CHIEF COMPLAINT:   Chief Complaint  Patient presents with  . GI Bleeding    HISTORY OF PRESENT ILLNESS:  Mark Ford  is a 70 y.o. male with a known history of esophageal adenocarcinoma stage IB in remission, diabetes, cirrhosis with varices presents to the emergency room with 2 days of melena, dizziness and severe anemia with hemoglobin of 6.5. Patient had hematemesis and bloody stools in February 2016 when EGD showed an ulcerated mass. 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 Patient had radiation and chemotherapy. Follows with Dr. Grayland Ormond. He was told that his cancer has resolved. Does not use any NSAIDs. Over the last 2 days he's noticed mild epigastric pain along with black tarry stools. No bright blood. No vomiting. He also has on and off exertional chest pain. History of CAD status post CABG. No acute changes on EKG.  PAST MEDICAL HISTORY:   Past Medical History  Diagnosis Date  . Cancer [facility])     adenocarcinoma fo the GE Junction  . GERD (gastroesophageal reflux disease)   . Diabetes mellitus without complication ([place])   . Thyroid disease     hypothroidism  . CAD (coronary artery disease)   . S/P CABG (coronary artery bypass graft)   . Hyperlipidemia   . 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 Esophageal varices ([place])     PAST SURGICAL HISTORY:   Past Surgical History  Procedure Laterality Date  . Coronary artery bypass graft    . Esophagogastroduodenoscopy (egd) with propofol N/A 02/11/2015    Procedure: ESOPHAGOGASTRODUODENOSCOPY (EGD) WITH PROPOFOL;  Surgeon: Manya Silvas, MD;  Location: [place];  Service: Endoscopy;  Laterality: N/A;    SOCIAL HISTORY:   Social  History  Substance Use Topics  . Smoking status: Never Smoker   . Smokeless tobacco: Never Used  . Alcohol Use: No    FAMILY HISTORY:   Family History  Problem Relation Age of Onset  . Hypertension      DRUG ALLERGIES:   Allergies  Allergen Reactions  . Shellfish Allergy Other (See Comments)    GOUT  . Tylenol [Acetaminophen] Other (See Comments)    Not allergic, but was told not to take it any more due to his liver condition.     REVIEW OF SYSTEMS:   Review of Systems  Constitutional: Negative for fever and chills.  HENT: Negative for sore throat.   Eyes: Negative for blurred vision, double vision and pain.  Respiratory: Negative for cough, hemoptysis, shortness of breath and wheezing.   Cardiovascular: Negative for chest pain, palpitations, orthopnea and leg swelling.  Gastrointestinal: Positive for abdominal pain and melena. Negative for heartburn, nausea, vomiting, diarrhea and constipation.  Genitourinary: Negative for dysuria and hematuria.  Musculoskeletal: Negative for back pain and joint pain.  Skin: Negative for rash.  Neurological: Positive for dizziness. Negative for sensory change, speech change, focal weakness and headaches.  Endo/Heme/Allergies: Does not bruise/bleed easily.  Psychiatric/Behavioral: Negative for depression. The patient is not nervous/anxious.     MEDICATIONS AT HOME:   Prior to Admission medications   Medication Sig Start Date End Date Taking? Authorizing Provider  allopurinol (ZYLOPRIM) 300 MG tablet Take 300 mg by mouth daily. Take in  the morning    Historical Provider, MD  furosemide (LASIX) 20 MG tablet Take 20 mg by mouth daily.    Historical Provider, MD  glipiZIDE (GLUCOTROL XL) 10 MG 24 hr tablet Take 10 mg by mouth daily with breakfast.    Historical Provider, MD  hydrocortisone (ANUSOL-HC) 25 MG suppository Place 1 suppository (25 mg total) rectally every 12 (twelve) hours. 10/20/14 10/20/15  Gregor Hams, MD  levothyroxine  (SYNTHROID, LEVOTHROID) 100 MCG tablet Take 100 mcg by mouth daily before breakfast.  08/24/14   Historical Provider, MD  metFORMIN (GLUCOPHAGE) 1000 MG tablet Take 1,000 mg by mouth 2 (two) times daily with a meal.    Historical Provider, MD  simvastatin (ZOCOR) 40 MG tablet Take 40 mg by mouth daily. Take at bedtime    Historical Provider, MD  tobramycin (TOBREX) 0.3 % ophthalmic solution Place 2 drops into the left eye every 4 (four) hours. 06/09/15   Johnn Hai, PA-C      VITAL SIGNS:  Blood pressure 119/53, pulse 87, temperature 97.6 F (36.4 C), temperature source Oral, resp. rate 23, height 5\' 7"  (1.702 m), weight 90.719 kg (200 lb), SpO2 97 %.  PHYSICAL EXAMINATION:  Physical Exam  GENERAL:  70 y.o.-year-old patient lying in the bed with no acute distress. Pale EYES: Pupils equal, round, reactive to light and accommodation. No scleral icterus. Extraocular muscles intact.  HEENT: Head atraumatic, normocephalic. Oropharynx and nasopharynx clear. No oropharyngeal erythema, moist oral mucosa  NECK:  Supple, no jugular venous distention. No thyroid enlargement, no tenderness.  LUNGS: Normal breath sounds bilaterally, no wheezing, rales, rhonchi. No use of accessory muscles of respiration.  CARDIOVASCULAR: S1, S2 normal. No murmurs, rubs, or gallops.  ABDOMEN: Soft, nontender, nondistended. Bowel sounds present. No organomegaly or mass.  EXTREMITIES: No pedal edema, cyanosis, or clubbing. + 2 pedal & radial pulses b/l.   NEUROLOGIC: Cranial nerves II through XII are intact. No focal Motor or sensory deficits appreciated b/l PSYCHIATRIC: The patient is alert and oriented x 3. Good affect.  SKIN: No obvious rash, lesion, or ulcer.   LABORATORY PANEL:   CBC  Recent Labs Lab 06/22/15 1027  WBC 3.9  HGB 6.3*  HCT 20.3*  PLT 100*   ------------------------------------------------------------------------------------------------------------------  Chemistries   Recent  Labs Lab 06/22/15 1027  NA 143  K 3.8  CL 110  CO2 23  GLUCOSE 236*  BUN 36*  CREATININE 0.82  CALCIUM 9.2  AST 29  ALT 16*  ALKPHOS 42  BILITOT 0.9   ------------------------------------------------------------------------------------------------------------------  Cardiac Enzymes No results for input(s): TROPONINI in the last 168 hours. ------------------------------------------------------------------------------------------------------------------  RADIOLOGY:  No results found.   IMPRESSION AND PLAN:   * Upper GI bleed with acute blood loss anemia Possible etiology is reoccurrence off adenocarcinoma or peptic ulcer disease or gastritis or esophageal varices. Admit patient to stepdown unit Start Protonix drip and octreotide drip. Serial hemoglobin levels. Transfuse 2 units of packed RBC and hold 2 units of packed RBC. Will need EGD. Consult GI. NPO  * Chest pain He has had on and off chest pain with exertion. This is likely due to his severe anemia. Does have history of CAD and is status post CABG. Check cardiac enzymes. Will need further workup if symptoms persist in spite of correction of anemia.  * Diabetes mellitus Sliding scale insulin.  * DVT prophylaxis with SCDs   All the records are reviewed and case discussed with ED provider. Management plans discussed with the patient, family  and they are in agreement.  CODE STATUS: FULL  TOTAL CC TIME TAKING CARE OF THIS PATIENT: 45 minutes.    Hillary Bow R M.D on 06/22/2015 at 1:13 PM  Between 7am to 6pm - Pager - [phone]  After 6pm go to [email or web address] - password [email or web address] [facility]  Office  [phone]  CC: Primary care physician; Maryland Pink, MD     Note: This dictation was prepared with Dragon dictation along with smaller phrase technology. Any transcriptional errors that result from this process are unintentional.

## 2015-06-22 NOTE — Care Management (Signed)
Patient with previous history adenocarcinoma of the GE junction.  Received chemo and radiation and says was told by oncology that cancer was in remission.  Patient presented after 2 days of bloody stools and weakness and admitted to stepdown.  GI consult pending, is NPO and on Octreotide and Protonix drips.    To receive 2 units of packed cells for hemoglobin of 6.3.

## 2015-06-22 NOTE — Consult Note (Signed)
GI Inpatient Consult Note  Reason for Consult: Upper GI bleed   Attending Requesting Consult: Sudini  History of Present Illness: Mark Ford is a 70 y.o. 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  He reports 6-8 BMs yesterday, all of which were thick, black, and tarry per patient.  He also reported generalized weakness and intermittent chest pressure, which worsens during BMs.   In the ED, Hgb returned at 6.3, Hct 20.3, and platelets 100; this is down from his baseline Hgb of 11.6.  Two units PRBCs, Protonix, and Octreotide were ordered and he was admitted for further management.  Patient is well known to [facility], and has been followed by Dr. Vira Agar.  His last EGD in 02/2015 showed grade II varices and moderate portal hypertensive gastropathy (treated w/ APC).  Prior to that scope, his EGD in 07/2014 demonstrated Grade I varices, a medium-sized ulcerated mass (adenocarcinoma), and erythematous mucosa in the gastric body.  Review of recent OV notes from Dr. Grayland Ormond indicate recent PET scan did not show evidence of residual esophageal disease.  Today, [name] reports no further melena since admission.  His chest discomfort and weakness have improved since admission.  He continues to remain abstinent from EtOH, and denies recent NSAID use.  He also denies recent fevers, chills, dysphagia, reflux, nausea/vomiting abdominal pain, change in bowel habits, or weight loss.  Past Medical History:  Past Medical History  Diagnosis Date  . Cancer [facility])     adenocarcinoma fo the GE Junction  . GERD (gastroesophageal reflux disease)   . Diabetes mellitus without complication ([place])   . Thyroid disease     hypothroidism  . CAD (coronary artery disease)   . S/P CABG (coronary artery bypass graft)   . Hyperlipidemia   .  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 Esophageal varices ([place])     Problem List: Patient Active Problem List   Diagnosis Date Noted  . GI bleed 06/22/2015  . Esophageal cancer ([place]) 11/11/2014    Past Surgical History: Past Surgical History  Procedure Laterality Date  . Coronary artery bypass graft    . Esophagogastroduodenoscopy (egd) with propofol N/A 02/11/2015    Procedure: ESOPHAGOGASTRODUODENOSCOPY (EGD) WITH PROPOFOL;  Surgeon: Manya Silvas, MD;  Location: [place];  Service: Endoscopy;  Laterality: N/A;    Allergies: Allergies  Allergen Reactions  . Shellfish Allergy Other (See Comments)    GOUT  . Tylenol [Acetaminophen] Other (See Comments)    Not allergic, but was told not to take it any more due to his liver condition.     Home Medications: Prescriptions prior to admission  Medication Sig Dispense Refill Last Dose  . allopurinol (ZYLOPRIM) 300 MG tablet Take 300 mg by mouth daily. Take in the morning   06/21/2015 at Unknown time  . glipiZIDE (GLUCOTROL XL) 10 MG 24 hr tablet Take 10 mg by mouth daily with breakfast.   06/21/2015 at Unknown time  . hydrocortisone (ANUSOL-HC) 25 MG suppository Place 1 suppository (25 mg total) rectally every 12 (twelve) hours. 12 suppository 1 06/21/2015 at Unknown time  . levothyroxine (SYNTHROID, LEVOTHROID) 100 MCG tablet Take 100 mcg by mouth daily before breakfast.    06/21/2015 at Unknown time  . metFORMIN (GLUCOPHAGE) 1000 MG tablet Take 1,000 mg by mouth 2 (two) times daily with a meal.  06/21/2015 at Unknown time  . simvastatin (ZOCOR) 40 MG tablet Take 40 mg by mouth daily. Take at bedtime   06/21/2015 at Unknown time  . tobramycin (TOBREX) 0.3 % ophthalmic solution Place 2 drops into the left eye every 4 (four) hours. (Patient not taking: Reported on 06/22/2015) 5 mL 0 Completed Course at Unknown time   Home medication reconciliation was completed with the patient.   Scheduled Inpatient Medications:   . insulin aspart  0-9 Units  Subcutaneous 6 times per day  . octreotide  50 mcg Intravenous Once  . pantoprazole (PROTONIX) IV  80 mg Intravenous Once  . sodium chloride  3 mL Intravenous Q12H    Continuous Inpatient Infusions:   . 0.9 % NaCl with KCl 20 mEq / L    . octreotide  (SANDOSTATIN)    IV infusion    . pantoprozole (PROTONIX) infusion      PRN Inpatient Medications:  albuterol, morphine injection, ondansetron **OR** ondansetron (ZOFRAN) IV, sodium phosphate  Family History: family history is not on file.  Social History:   reports that he has never smoked. He has never used smokeless tobacco. He reports that he does not drink alcohol or use illicit drugs.  Review of Systems: Constitutional: Weight is stable.  Eyes: No changes in vision. ENT: No oral lesions, sore throat.  GI: see HPI.  Heme/Lymph: No easy bruising.  CV: No chest pain.  GU: No hematuria.  Integumentary: No rashes.  Neuro: No headaches.  Psych: No depression/anxiety.  Endocrine: No heat/cold intolerance.  Allergic/Immunologic: No urticaria.  Resp: No cough, SOB.  Musculoskeletal: No joint swelling.    Physical Examination: BP 118/52 mmHg  Pulse 93  Temp(Src) 98 F (36.7 C) (Oral)  Resp 23  Ht 5\' 7"  (1.702 m)  Wt 90.719 kg (200 lb)  BMI 31.32 kg/m2  SpO2 100% Gen: NAD, alert and oriented x 4 HEENT: PEERLA, EOMI, Neck: supple, no JVD or thyromegaly Chest: CTA bilaterally, no wheezes, crackles, or other adventitious sounds CV: RRR, no m/g/c/r Abd: soft, NT, ND, +BS in all four quadrants; no HSM, guarding, ridigity, or rebound tenderness Ext: no edema, well perfused with 2+ pulses, Skin: no rash or lesions noted Lymph: no LAD  Data: Lab Results  Component Value Date   WBC 3.9 06/22/2015   HGB 6.3* 06/22/2015   HCT 20.3* 06/22/2015   MCV 80.7 06/22/2015   PLT 100* 06/22/2015    Recent Labs Lab 06/22/15 1027  HGB 6.3*   Lab Results  Component Value Date   NA 143 06/22/2015   K 3.8 06/22/2015   CL 110  06/22/2015   CO2 23 06/22/2015   BUN 36* 06/22/2015   CREATININE 0.82 06/22/2015   Lab Results  Component Value Date   ALT 16* 06/22/2015   AST 29 06/22/2015   ALKPHOS 42 06/22/2015   BILITOT 0.9 06/22/2015    Recent Labs Lab 06/22/15 1027  APTT 34  INR 1.58   Assessment/Plan: Mr. Huel Cote is a 70 y.o. 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 Stool changes c/w melena x 2 days along with weakness and chest discomfort.  Hgb dropped approximately 5 pts from baseline of 11, awaiting Hgb after receiving 2 units.  Suspecting variceal bleed d/t portal HTN and cirrhosis, PUD also a consideration.  He has not had further melena today, so will plan  for EGD tomorrow after Hgb improves.  Will continue to follow; further recommendations per Dr. Rayann Heman.    Recommendations: - Monitor Hgb, transfuse if <7 - Plan for EGD tomorrow once Hgb improves - Continue IV octreotide and protonix  Thank you for the consult. We will follow along with you. Please call with questions or concerns.  Lavera Guise, PA-C [facility] Phone: [phone] Pager: [phone]

## 2015-06-22 NOTE — Progress Notes (Addendum)
CRITICAL VALUE ALERT  Critical value received: Troponin 1.09  Date of notification:  06/22/15  Time of notification:  2205  Critical value read back:Yes.    Nurse who received alert:  Adin Hector  MD notified (1st page):  Prime doc  Time of first page:  2228  Responding MD:  Dr. Jannifer Franklin  Time MD responded:  2232  Spoke with Dr. Jannifer Franklin, pt not on Heparin drip d/t possible GI bleed. Will continue to monitor symptoms and hgb level. Will notify with follow-up hgb.

## 2015-06-22 NOTE — ED Notes (Signed)
Pt c/o black tarry stool since yesterday with intermittent abd cramping with nausea

## 2015-06-22 NOTE — Progress Notes (Signed)
[facility] Progress Note Patient Name: Mark Ford DOB: [birth year] MRN: [id number]   Date of Service  06/22/2015  HPI/Events of Note  70 yo with cirrhosis, eso adeno ca stage IB, presents with possible UGIB, low Hb  eICU Interventions  Transfusing 2u prbc Monitor [facility]     Intervention Category Evaluation Type: New Patient Evaluation  Cloa Bushong 06/22/2015, 3:40 PM

## 2015-06-23 ENCOUNTER — Encounter: Payer: Self-pay | Admitting: Anesthesiology

## 2015-06-23 ENCOUNTER — Encounter
Admission: EM | Disposition: A | Discharge status: Home / Self Care | Payer: Self-pay | Source: Home / Self Care | Attending: Internal Medicine

## 2015-06-23 ENCOUNTER — Inpatient Hospital Stay
Admit: 2015-06-23 | Discharge: 2015-06-23 | Disposition: A | Discharge status: Home / Self Care | Payer: Medicare Other | Attending: Cardiovascular Disease | Admitting: Cardiovascular Disease

## 2015-06-23 HISTORY — PX: ESOPHAGOGASTRODUODENOSCOPY (EGD) WITH PROPOFOL: SHX5813

## 2015-06-23 LAB — CBC
HEMATOCRIT: 27 % — AB (ref 40.0–52.0)
HEMOGLOBIN: 8.9 g/dL — AB (ref 13.0–18.0)
MCH: 27.3 pg (ref 26.0–34.0)
MCHC: 32.9 g/dL (ref 32.0–36.0)
MCV: 82.8 fL (ref 80.0–100.0)
Platelets: 73 10*3/uL — ABNORMAL LOW (ref 150–440)
RBC: 3.26 MIL/uL — ABNORMAL LOW (ref 4.40–5.90)
RDW: 17.4 % — AB (ref 11.5–14.5)
WBC: 3.6 10*3/uL — AB (ref 3.8–10.6)

## 2015-06-23 LAB — COMPREHENSIVE METABOLIC PANEL
ALK PHOS: 34 U/L — AB (ref 38–126)
ALT: 15 U/L — AB (ref 17–63)
AST: 30 U/L (ref 15–41)
Albumin: 3.2 g/dL — ABNORMAL LOW (ref 3.5–5.0)
Anion gap: 4 — ABNORMAL LOW (ref 5–15)
BUN: 27 mg/dL — AB (ref 6–20)
CALCIUM: 8.4 mg/dL — AB (ref 8.9–10.3)
CHLORIDE: 115 mmol/L — AB (ref 101–111)
CO2: 26 mmol/L (ref 22–32)
CREATININE: 0.71 mg/dL (ref 0.61–1.24)
Glucose, Bld: 119 mg/dL — ABNORMAL HIGH (ref 65–99)
Potassium: 3.9 mmol/L (ref 3.5–5.1)
Sodium: 145 mmol/L (ref 135–145)
Total Bilirubin: 1 mg/dL (ref 0.3–1.2)
Total Protein: 5.4 g/dL — ABNORMAL LOW (ref 6.5–8.1)

## 2015-06-23 LAB — TROPONIN I
TROPONIN I: 1.3 ng/mL — AB (ref <0.031)
TROPONIN I: 1.2 ng/mL — AB (ref <0.031)
Troponin I: 1.21 ng/mL — ABNORMAL HIGH (ref <0.031)

## 2015-06-23 LAB — GLUCOSE, CAPILLARY
GLUCOSE-CAPILLARY: 102 mg/dL — AB (ref 65–99)
Glucose-Capillary: 105 mg/dL — ABNORMAL HIGH (ref 65–99)
Glucose-Capillary: 108 mg/dL — ABNORMAL HIGH (ref 65–99)
Glucose-Capillary: 118 mg/dL — ABNORMAL HIGH (ref 65–99)
Glucose-Capillary: 163 mg/dL — ABNORMAL HIGH (ref 65–99)

## 2015-06-23 SURGERY — ESOPHAGOGASTRODUODENOSCOPY (EGD) WITH PROPOFOL
Anesthesia: General

## 2015-06-23 MED ORDER — NADOLOL 20 MG PO TABS
40.0000 mg | ORAL_TABLET | Freq: Every day | ORAL | Status: DC
  Administered 2015-06-23: 40 mg via ORAL
  Filled 2015-06-23: qty 2

## 2015-06-23 MED ORDER — SODIUM CHLORIDE 0.9 % IV SOLN
INTRAVENOUS | Status: DC
  Administered 2015-06-23: 1000 mL via INTRAVENOUS

## 2015-06-23 MED ORDER — MIDAZOLAM HCL 5 MG/5ML IJ SOLN
INTRAMUSCULAR | Status: DC | PRN
  Administered 2015-06-23: 2 mg via INTRAVENOUS
  Administered 2015-06-23: 1 mg via INTRAVENOUS

## 2015-06-23 MED ORDER — PANTOPRAZOLE SODIUM 40 MG PO TBEC
40.0000 mg | DELAYED_RELEASE_TABLET | Freq: Every day | ORAL | Status: DC
  Administered 2015-06-23 – 2015-06-24 (×2): 40 mg via ORAL
  Filled 2015-06-23 (×2): qty 1

## 2015-06-23 MED ORDER — DEXTROSE 5 % IV SOLN
1.0000 g | INTRAVENOUS | Status: DC
  Administered 2015-06-23 – 2015-06-24 (×2): 1 g via INTRAVENOUS
  Filled 2015-06-23 (×4): qty 10

## 2015-06-23 MED ORDER — SODIUM CHLORIDE 0.9 % IV SOLN
INTRAVENOUS | Status: DC
  Administered 2015-06-23: 20:00:00 via INTRAVENOUS

## 2015-06-23 MED ORDER — ALLOPURINOL 300 MG PO TABS
300.0000 mg | ORAL_TABLET | Freq: Every day | ORAL | Status: DC
  Administered 2015-06-23 – 2015-06-25 (×3): 300 mg via ORAL
  Filled 2015-06-23 (×3): qty 1

## 2015-06-23 MED ORDER — LEVOTHYROXINE SODIUM 50 MCG PO TABS
100.0000 ug | ORAL_TABLET | Freq: Every day | ORAL | Status: DC
  Administered 2015-06-24 – 2015-06-25 (×2): 100 ug via ORAL
  Filled 2015-06-23 (×3): qty 2

## 2015-06-23 MED ORDER — SIMVASTATIN 40 MG PO TABS
40.0000 mg | ORAL_TABLET | Freq: Every day | ORAL | Status: DC
  Administered 2015-06-23 – 2015-06-24 (×2): 40 mg via ORAL
  Filled 2015-06-23 (×2): qty 1

## 2015-06-23 MED ORDER — FENTANYL CITRATE (PF) 100 MCG/2ML IJ SOLN
INTRAMUSCULAR | Status: DC | PRN
Start: 2015-06-23 — End: 2015-06-23
  Administered 2015-06-23: 50 ug via INTRAVENOUS
  Administered 2015-06-23: 25 ug via INTRAVENOUS

## 2015-06-23 MED ORDER — INFLUENZA VAC SPLIT QUAD 0.5 ML IM SUSY: 0.5000 mL | PREFILLED_SYRINGE | INTRAMUSCULAR | Status: DC | PRN

## 2015-06-23 MED ORDER — BUTAMBEN-TETRACAINE-BENZOCAINE 2-2-14 % EX AERO
INHALATION_SPRAY | CUTANEOUS | Status: DC | PRN
  Administered 2015-06-23: 1 via TOPICAL

## 2015-06-23 NOTE — Interval H&P Note (Signed)
History and Physical Interval Note:  06/23/2015 12:24 PM  Mark Ford  has presented today for surgery, with the diagnosis of melena  The various methods of treatment have been discussed with the patient and family. After consideration of risks, benefits and other options for treatment, the patient has consented to  Procedure(s): ESOPHAGOGASTRODUODENOSCOPY (EGD) WITH PROPOFOL (N/A) as a surgical intervention .  The patient's history has been reviewed, patient examined, no change in status, stable for surgery.  I have reviewed the patient's chart and labs.  Questions were answered to the patient's satisfaction.     REIN, MATTHEW GORDON

## 2015-06-23 NOTE — Op Note (Signed)
[facility] Gastroenterology Patient Name: Mark Ford Procedure Date: 06/23/2015 12:27 PM MRN: [id number] Account #: [id number] Date of Birth: May 20, 1946 Admit Type: Inpatient Age: 70 Room: [facility] ENDO ROOM 1 Gender: Male Note Status: Finalized Procedure:         Upper GI endoscopy Indications:       Acute post hemorrhagic anemia, Melena, hx esophageal                     varices, hx esphageal Ca Patient Profile:   This is a 70 year old male. Providers:         Gerrit Heck. Rayann Heman, MD Referring MD:      Irven Easterly. Kary Kos, MD (Referring MD) Medicines:         Fentanyl 75 micrograms IV, Midazolam 3 mg IV Complications:     No immediate complications. Procedure:         Pre-Anesthesia Assessment:                    - Prior to the procedure, a History and Physical was                     performed, and patient medications, allergies and                     sensitivities were reviewed. The patient's tolerance of                     previous anesthesia was reviewed.                    After obtaining informed consent, the endoscope was passed                     under direct vision. Throughout the procedure, the                     patient's blood pressure, pulse, and oxygen saturations                     were monitored continuously. The Endoscope was introduced                     through the mouth, and advanced to the second part of                     duodenum. The upper GI endoscopy was accomplished without                     difficulty. The patient tolerated the procedure well. Findings:      Grade II varices were found in the middle third of the esophagus and in       the lower third of the esophagus. There was a possible nipple sign near       GEJ, suggestive of recent bleeding. Three bands were successfully placed       with complete eradication, resulting in deflation of varices. There was       no bleeding during the procedure.      Mild portal hypertensive  gastropathy was found in the gastric body.      The examined duodenum was normal. Impression:        - Grade II esophageal varices with nipple sign. Banded.  Completely eradicated.                    - Portal hypertensive gastropathy.                    - Normal examined duodenum.                    - No specimens collected. Recommendation:    - Observe patient in GI recovery unit.                    - Clear liquid diet for 1 day, then soft mechanical diet                     for 1 week                    - No NG tubes for 7 days                    - Ok to change protonix to 40 mg PO daily                    - taper octreotide drip off over 48 hours                    - Start nadolol 40 mg daily, titrate to HR of 60.                    - abd u/s to eval for ascites                    - Continue present medications.                    - Repeat the upper endoscopy in 4 weeks for retreatment.                    - The findings and recommendations were discussed with the                     patient.                    - The findings and recommendations were discussed with the                     patient's family. Procedure Code(s): --- Professional ---                    [phone], Esophagogastroduodenoscopy, flexible, transoral;                     with band ligation of esophageal/gastric varices Diagnosis Code(s): --- Professional ---                    I85.00, Esophageal varices without bleeding                    K76.6, Portal hypertension                    K31.89, Other diseases of stomach and duodenum                    D62, Acute posthemorrhagic anemia                    K92.1, Melena CPT  copyright 2014 American Medical Association. All rights reserved. The codes documented in this report are preliminary and upon coder review may  be revised to meet current compliance requirements. Mellody Life, MD 06/23/2015 12:57:38 PM This report has been signed  electronically. Number of Addenda: 0 Note Initiated On: 06/23/2015 12:27 PM      [facility]

## 2015-06-23 NOTE — Progress Notes (Signed)
Patient ID: Mark Ford, male   DOB: 1945/07/19, 70 y.o.   MRN: [id number] [facility] PROGRESS NOTE  PCP: Maryland Pink, MD  HPI/Subjective: Patient had black stools numerous times prior to coming in. He's feeling okay now. No nausea or vomiting. Seen after he came back from endoscopy. No abdominal pain  Objective: Filed Vitals:   06/23/15 1322 06/23/15 1500  BP: 142/63 137/62  Pulse: 79 75  Temp:    Resp: 18 20    Filed Weights   06/22/15 1415 06/23/15 0500 06/23/15 1204  Weight: 90.3 kg (199 lb 1.2 oz) 90.2 kg (198 lb 13.7 oz) 90.719 kg (200 lb)    ROS: Review of Systems  Constitutional: Negative for fever and chills.  Eyes: Negative for blurred vision.  Respiratory: Negative for cough and shortness of breath.   Cardiovascular: Negative for chest pain.  Gastrointestinal: Negative for nausea, vomiting, abdominal pain, diarrhea and constipation.  Genitourinary: Negative for dysuria.  Musculoskeletal: Negative for joint pain.  Neurological: Negative for dizziness and headaches.   Exam: Physical Exam  Constitutional: He is oriented to person, place, and time.  HENT:  Nose: No mucosal edema.  Mouth/Throat: No oropharyngeal exudate or posterior oropharyngeal edema.  Eyes: Conjunctivae, EOM and lids are normal. Pupils are equal, round, and reactive to light.  Neck: No JVD present. Carotid bruit is not present. No edema present. No thyroid mass and no thyromegaly present.  Cardiovascular: S1 normal and S2 normal.  Exam reveals no gallop.   No murmur heard. Pulses:      Dorsalis pedis pulses are 2+ on the right side, and 2+ on the left side.  Respiratory: No respiratory distress. He has no wheezes. He has no rhonchi. He has no rales.  GI: Soft. Bowel sounds are normal. There is no tenderness.  Musculoskeletal:       Right ankle: He exhibits swelling.       Left ankle: He exhibits swelling.  Lymphadenopathy:    He has no cervical adenopathy.   Neurological: He is alert and oriented to person, place, and time. No cranial nerve deficit.  Skin: Skin is warm. No rash noted. Nails show no clubbing.  Psychiatric: He has a normal mood and affect.    Data Reviewed: Basic Metabolic Panel:  Recent Labs Lab 06/22/15 1027 06/23/15 0352  NA 143 145  K 3.8 3.9  CL 110 115*  CO2 23 26  GLUCOSE 236* 119*  BUN 36* 27*  CREATININE 0.82 0.71  CALCIUM 9.2 8.4*   Liver Function Tests:  Recent Labs Lab 06/22/15 1027 06/23/15 0352  AST 29 30  ALT 16* 15*  ALKPHOS 42 34*  BILITOT 0.9 1.0  PROT 5.9* 5.4*  ALBUMIN 3.6 3.2*   CBC:  Recent Labs Lab 06/22/15 1027 06/22/15 2253 06/23/15 0911  WBC 3.9  --  3.6*  HGB 6.3* 7.2* 8.9*  HCT 20.3*  --  27.0*  MCV 80.7  --  82.8  PLT 100*  --  73*   Cardiac Enzymes:  Recent Labs Lab 06/22/15 1437 06/22/15 2116 06/23/15 0352 06/23/15 0911 06/23/15 1520  TROPONINI 0.71* 1.09* 1.30* 1.20* 1.21*    CBG:  Recent Labs Lab 06/22/15 1910 06/22/15 2359 06/23/15 0420 06/23/15 0737 06/23/15 1621  GLUCAP 116* 108* 102* 118* 105*    Recent Results (from the past 240 hour(s))  MRSA PCR Screening     Status: None   Collection Time: 06/22/15  2:56 PM  Result Value Ref Range Status  MRSA by PCR NEGATIVE NEGATIVE Final    Comment:        The GeneXpert MRSA Assay (FDA approved for NASAL specimens only), is one component of a comprehensive MRSA colonization surveillance program. It is not intended to diagnose MRSA infection nor to guide or monitor treatment for MRSA infections.      Scheduled Meds: . antiseptic oral rinse  7 mL Mouth Rinse BID  . cefTRIAXone (ROCEPHIN)  IV  1 g Intravenous Q24H  . insulin aspart  0-9 Units Subcutaneous 6 times per day  . octreotide  50 mcg Intravenous Once  . pantoprazole  40 mg Oral QAC breakfast  . sodium chloride  3 mL Intravenous Q12H   Continuous Infusions: . octreotide  (SANDOSTATIN)    IV infusion 50 mcg/hr (06/23/15 1521)     Assessment/Plan:  1. Acute blood loss anemia. 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 Patient is on Protonix and octreotide. In history listed as cryptogenic cirrhosis. Patient states he does not drink alcohol. 2. History of esophageal cancer status post radiation 3. Type 2 diabetes mellitus on sliding scale 4. Hypothyroidism unspecified can restart levothyroxine 5. Elevated troponin demand ischemia from severe anemia 6. Hyperlipidemia unspecified- restart simvastatin 7. History of gout on allopurinol  Code Status:     Code Status Orders        Start     Ordered   06/22/15 1233  Full code   Continuous     06/22/15 1233    Code Status History    Date Active Date Inactive Code Status Order ID Comments User Context   This patient has a current code status but no historical code status.     Disposition Plan: Home soon  Consultants:  Cardiology  Gastroenterology  Time spent: 25 minutes  Loletha Grayer  [facility]

## 2015-06-23 NOTE — H&P (View-Only) (Signed)
GI Inpatient Consult Note  Reason for Consult: Upper GI bleed   Attending Requesting Consult: Sudini  History of Present Illness: Mark Ford is a 70 y.o. 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  He reports 6-8 BMs yesterday, all of which were thick, black, and tarry per patient.  He also reported generalized weakness and intermittent chest pressure, which worsens during BMs.   In the ED, Hgb returned at 6.3, Hct 20.3, and platelets 100; this is down from his baseline Hgb of 11.6.  Two units PRBCs, Protonix, and Octreotide were ordered and he was admitted for further management.  Patient is well known to [facility], and has been followed by Dr. Vira Agar.  His last EGD in 02/2015 showed grade II varices and moderate portal hypertensive gastropathy (treated w/ APC).  Prior to that scope, his EGD in 07/2014 demonstrated Grade I varices, a medium-sized ulcerated mass (adenocarcinoma), and erythematous mucosa in the gastric body.  Review of recent OV notes from Dr. Grayland Ormond indicate recent PET scan did not show evidence of residual esophageal disease.  Today, [name] reports no further melena since admission.  His chest discomfort and weakness have improved since admission.  He continues to remain abstinent from EtOH, and denies recent NSAID use.  He also denies recent fevers, chills, dysphagia, reflux, nausea/vomiting abdominal pain, change in bowel habits, or weight loss.  Past Medical History:  Past Medical History  Diagnosis Date  . Cancer [facility])     adenocarcinoma fo the [place]  . GERD (gastroesophageal reflux disease)   . Diabetes mellitus without complication ([place])   . Thyroid disease     hypothroidism  . CAD (coronary artery disease)   . S/P CABG (coronary artery bypass graft)   . Hyperlipidemia   .  Cirrhosis, cryptogenic ([place])   . Esophageal varices ([place])     Problem List: Patient Active Problem List   Diagnosis Date Noted  . GI bleed 06/22/2015  . Esophageal cancer ([place]) 11/11/2014    Past Surgical History: Past Surgical History  Procedure Laterality Date  . Coronary artery bypass graft    . Esophagogastroduodenoscopy (egd) with propofol N/A 02/11/2015    Procedure: ESOPHAGOGASTRODUODENOSCOPY (EGD) WITH PROPOFOL;  Surgeon: Manya Silvas, MD;  Location: [facility];  Service: Endoscopy;  Laterality: N/A;    Allergies: Allergies  Allergen Reactions  . Shellfish Allergy Other (See Comments)    GOUT  . Tylenol [Acetaminophen] Other (See Comments)    Not allergic, but was told not to take it any more due to his liver condition.     Home Medications: Prescriptions prior to admission  Medication Sig Dispense Refill Last Dose  . allopurinol (ZYLOPRIM) 300 MG tablet Take 300 mg by mouth daily. Take in the morning   06/21/2015 at Unknown time  . glipiZIDE (GLUCOTROL XL) 10 MG 24 hr tablet Take 10 mg by mouth daily with breakfast.   06/21/2015 at Unknown time  . hydrocortisone (ANUSOL-HC) 25 MG suppository Place 1 suppository (25 mg total) rectally every 12 (twelve) hours. 12 suppository 1 06/21/2015 at Unknown time  . levothyroxine (SYNTHROID, LEVOTHROID) 100 MCG tablet Take 100 mcg by mouth daily before breakfast.    06/21/2015 at Unknown time  . metFORMIN (GLUCOPHAGE) 1000 MG tablet Take 1,000 mg by mouth 2 (two) times daily with a meal.  06/21/2015 at Unknown time  . simvastatin (ZOCOR) 40 MG tablet Take 40 mg by mouth daily. Take at bedtime   06/21/2015 at Unknown time  . tobramycin (TOBREX) 0.3 % ophthalmic solution Place 2 drops into the left eye every 4 (four) hours. (Patient not taking: Reported on 06/22/2015) 5 mL 0 Completed Course at Unknown time   Home medication reconciliation was completed with the patient.   Scheduled Inpatient Medications:   . insulin aspart  0-9 Units  Subcutaneous 6 times per day  . octreotide  50 mcg Intravenous Once  . pantoprazole (PROTONIX) IV  80 mg Intravenous Once  . sodium chloride  3 mL Intravenous Q12H    Continuous Inpatient Infusions:   . 0.9 % NaCl with KCl 20 mEq / L    . octreotide  (SANDOSTATIN)    IV infusion    . pantoprozole (PROTONIX) infusion      PRN Inpatient Medications:  albuterol, morphine injection, ondansetron **OR** ondansetron (ZOFRAN) IV, sodium phosphate  Family History: family history is not on file.  Social History:   reports that he has never smoked. He has never used smokeless tobacco. He reports that he does not drink alcohol or use illicit drugs.  Review of Systems: Constitutional: Weight is stable.  Eyes: No changes in vision. ENT: No oral lesions, sore throat.  GI: see HPI.  Heme/Lymph: No easy bruising.  CV: No chest pain.  GU: No hematuria.  Integumentary: No rashes.  Neuro: No headaches.  Psych: No depression/anxiety.  Endocrine: No heat/cold intolerance.  Allergic/Immunologic: No urticaria.  Resp: No cough, SOB.  Musculoskeletal: No joint swelling.    Physical Examination: BP 118/52 mmHg  Pulse 93  Temp(Src) 98 F (36.7 C) (Oral)  Resp 23  Ht 5\' 7"  (1.702 m)  Wt 90.719 kg (200 lb)  BMI 31.32 kg/m2  SpO2 100% Gen: NAD, alert and oriented x 4 HEENT: PEERLA, EOMI, Neck: supple, no JVD or thyromegaly Chest: CTA bilaterally, no wheezes, crackles, or other adventitious sounds CV: RRR, no m/g/c/r Abd: soft, NT, ND, +BS in all four quadrants; no HSM, guarding, ridigity, or rebound tenderness Ext: no edema, well perfused with 2+ pulses, Skin: no rash or lesions noted Lymph: no LAD  Data: Lab Results  Component Value Date   WBC 3.9 06/22/2015   HGB 6.3* 06/22/2015   HCT 20.3* 06/22/2015   MCV 80.7 06/22/2015   PLT 100* 06/22/2015    Recent Labs Lab 06/22/15 1027  HGB 6.3*   Lab Results  Component Value Date   NA 143 06/22/2015   K 3.8 06/22/2015   CL 110  06/22/2015   CO2 23 06/22/2015   BUN 36* 06/22/2015   CREATININE 0.82 06/22/2015   Lab Results  Component Value Date   ALT 16* 06/22/2015   AST 29 06/22/2015   ALKPHOS 42 06/22/2015   BILITOT 0.9 06/22/2015    Recent Labs Lab 06/22/15 1027  APTT 34  INR 1.58   Assessment/Plan: Mr. Huel Cote is a 69 y.o. 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 Stool changes c/w melena x 2 days along with weakness and chest discomfort.  Hgb dropped approximately 5 pts from baseline of 11, awaiting Hgb after receiving 2 units.  Suspecting variceal bleed d/t portal HTN and cirrhosis, PUD also a consideration.  He has not had further melena today, so will plan  for EGD tomorrow after Hgb improves.  Will continue to follow; further recommendations per Dr. Rayann Heman.    Recommendations: - Monitor Hgb, transfuse if <7 - Plan for EGD tomorrow once Hgb improves - Continue IV octreotide and protonix  Thank you for the consult. We will follow along with you. Please call with questions or concerns.  Lavera Guise, PA-C [facility] Phone: [phone] Pager: [phone]

## 2015-06-23 NOTE — Progress Notes (Signed)
Mark Ford is a 70 y.o. male  [id number]  Primary Cardiologist: Neoma Laming Reason for Consultation: Elevated troponin  HPI: This is a 70 year old white male with a past medical history of three-vessel CABG at [facility] in 2011, presented with the blood in the stool and was found to have severe anemia with hemoglobin 6. Patient denies any chest pain was was feeling short of breath and weak and tired.   Review of Systems: No chest pain orthopnea PND or leg swelling   Past Medical History  Diagnosis Date  . Cancer [facility])     adenocarcinoma fo the [place]  . GERD (gastroesophageal reflux disease)   . Diabetes mellitus without complication ([place])   . Thyroid disease     hypothroidism  . CAD (coronary artery disease)   . S/P CABG (coronary artery bypass graft)   . Hyperlipidemia   . Cirrhosis, cryptogenic ([place])   . Esophageal varices ([facility])     Medications Prior to Admission  Medication Sig Dispense Refill  . allopurinol (ZYLOPRIM) 300 MG tablet Take 300 mg by mouth daily. Take in the morning    . glipiZIDE (GLUCOTROL XL) 10 MG 24 hr tablet Take 10 mg by mouth daily with breakfast.    . hydrocortisone (ANUSOL-HC) 25 MG suppository Place 1 suppository (25 mg total) rectally every 12 (twelve) hours. 12 suppository 1  . levothyroxine (SYNTHROID, LEVOTHROID) 100 MCG tablet Take 100 mcg by mouth daily before breakfast.     . metFORMIN (GLUCOPHAGE) 1000 MG tablet Take 1,000 mg by mouth 2 (two) times daily with a meal.    . simvastatin (ZOCOR) 40 MG tablet Take 40 mg by mouth daily. Take at bedtime    . tobramycin (TOBREX) 0.3 % ophthalmic solution Place 2 drops into the left eye every 4 (four) hours. (Patient not taking: Reported on 06/22/2015) 5 mL 0     . antiseptic oral rinse  7 mL Mouth Rinse BID  . cefTRIAXone (ROCEPHIN)  IV  1 g Intravenous Q24H  . insulin aspart  0-9 Units Subcutaneous 6 times per day  . octreotide  50 mcg Intravenous Once  . pantoprazole  (PROTONIX) IV  80 mg Intravenous Once  . sodium chloride  3 mL Intravenous Q12H    Infusions: . 0.9 % NaCl with KCl 20 mEq / L 75 mL/hr at 06/22/15 1900  . octreotide  (SANDOSTATIN)    IV infusion 50 mcg/hr (06/23/15 0129)  . pantoprozole (PROTONIX) infusion 8 mg/hr (06/23/15 0234)    Allergies  Allergen Reactions  . Shellfish Allergy Other (See Comments)    GOUT  . Tylenol [Acetaminophen] Other (See Comments)    Not allergic, but was told not to take it any more due to his liver condition.     Social History   Social History  . Marital Status: Married    Spouse Name: N/A  . Number of Children: N/A  . Years of Education: N/A   Occupational History  . Not on file.   Social History Main Topics  . Smoking status: Never Smoker   . Smokeless tobacco: Never Used  . Alcohol Use: No  . Drug Use: No  . Sexual Activity: Not on file   Other Topics Concern  . Not on file   Social History Narrative    Family History  Problem Relation Age of Onset  . Hypertension      PHYSICAL EXAM: Filed Vitals:   06/23/15 0312 06/23/15 0330  BP:  120/54 116/69  Pulse: 79 76  Temp: 98.3 F (36.8 C) 98.3 F (36.8 C)  Resp: 18 20     Intake/Output Summary (Last 24 hours) at 06/23/15 0349 Last data filed at 06/23/15 0305  Gross per 24 hour  Intake 3485.92 ml  Output    725 ml  Net 2760.92 ml    General:  Well appearing. No respiratory difficulty HEENT: normal Neck: supple. no JVD. Carotids 2+ bilat; no bruits. No lymphadenopathy or thryomegaly appreciated. Cor: PMI nondisplaced. Regular rate & rhythm. No rubs, gallops or murmurs. Lungs: clear Abdomen: soft, nontender, nondistended. No hepatosplenomegaly. No bruits or masses. Good bowel sounds. Extremities: no cyanosis, clubbing, rash, edema Neuro: alert & oriented x 3, cranial nerves grossly intact. moves all 4 extremities w/o difficulty. Affect pleasant.  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 and sinus  tachycardia  Results for orders placed or performed during the hospital encounter of 06/22/15 (from the past 24 hour(s))  Comprehensive metabolic panel     Status: Abnormal   Collection Time: 06/22/15 10:27 AM  Result Value Ref Range   Sodium 143 135 - 145 mmol/L   Potassium 3.8 3.5 - 5.1 mmol/L   Chloride 110 101 - 111 mmol/L   CO2 23 22 - 32 mmol/L   Glucose, Bld 236 (H) 65 - 99 mg/dL   BUN 36 (H) 6 - 20 mg/dL   Creatinine, Ser 0.82 0.61 - 1.24 mg/dL   Calcium 9.2 8.9 - 10.3 mg/dL   Total Protein 5.9 (L) 6.5 - 8.1 g/dL   Albumin 3.6 3.5 - 5.0 g/dL   AST 29 15 - 41 U/L   ALT 16 (L) 17 - 63 U/L   Alkaline Phosphatase 42 38 - 126 U/L   Total Bilirubin 0.9 0.3 - 1.2 mg/dL   GFR calc non Af Amer >60 >60 mL/min   GFR calc Af Amer >60 >60 mL/min   Anion gap 10 5 - 15  CBC     Status: Abnormal   Collection Time: 06/22/15 10:27 AM  Result Value Ref Range   WBC 3.9 3.8 - 10.6 K/uL   RBC 2.52 (L) 4.40 - 5.90 MIL/uL   Hemoglobin 6.3 (L) 13.0 - 18.0 g/dL   HCT 20.3 (L) 40.0 - 52.0 %   MCV 80.7 80.0 - 100.0 fL   MCH 25.1 (L) 26.0 - 34.0 pg   MCHC 31.1 (L) 32.0 - 36.0 g/dL   RDW 18.7 (H) 11.5 - 14.5 %   Platelets 100 (L) 150 - 440 K/uL  Type and screen [facility]     Status: None (Preliminary result)   Collection Time: 06/22/15 10:27 AM  Result Value Ref Range   ABO/RH(D) O POS    Antibody Screen NEG    Sample Expiration 06/25/2015    Unit Number [id number]    Blood Component Type RED CELLS,LR    Unit division 00    Status of Unit ISSUED    Transfusion Status OK TO TRANSFUSE    Crossmatch Result Compatible    Unit Number [id number]    Blood Component Type RED CELLS,LR    Unit division 00    Status of Unit ISSUED    Transfusion Status OK TO TRANSFUSE    Crossmatch Result Compatible    Unit Number [id number]    Blood Component Type RBC LR PHER2    Unit division 00    Status of Unit ISSUED    Transfusion Status OK TO TRANSFUSE  Crossmatch  Result Compatible    Unit Number [id number]    Blood Component Type RED CELLS,LR    Unit division 00    Status of Unit ISSUED    Transfusion Status OK TO TRANSFUSE    Crossmatch Result Compatible    Unit Number [id number]    Blood Component Type RED CELLS,LR    Unit division 00    Status of Unit ALLOCATED    Transfusion Status OK TO TRANSFUSE    Crossmatch Result Compatible    Unit Number [id number]    Blood Component Type RED CELLS,LR    Unit division 00    Status of Unit ALLOCATED    Transfusion Status OK TO TRANSFUSE    Crossmatch Result Compatible   ABO/Rh     Status: None   Collection Time: 06/22/15 10:27 AM  Result Value Ref Range   ABO/RH(D) O POS   Prepare RBC     Status: None   Collection Time: 06/22/15 10:27 AM  Result Value Ref Range   Order Confirmation ORDER PROCESSED BY BLOOD BANK   Troponin I     Status: Abnormal   Collection Time: 06/22/15 10:27 AM  Result Value Ref Range   Troponin I 0.64 (H) <0.031 ng/mL  Protime-INR     Status: Abnormal   Collection Time: 06/22/15 10:27 AM  Result Value Ref Range   Prothrombin Time 18.9 (H) 11.4 - 15.0 seconds   INR 1.58   APTT     Status: None   Collection Time: 06/22/15 10:27 AM  Result Value Ref Range   aPTT 34 24 - 36 seconds  Troponin I (q 6hr x 3)     Status: Abnormal   Collection Time: 06/22/15  2:37 PM  Result Value Ref Range   Troponin I 0.71 (H) <0.031 ng/mL  MRSA PCR Screening     Status: None   Collection Time: 06/22/15  2:56 PM  Result Value Ref Range   MRSA by PCR NEGATIVE NEGATIVE  Glucose, capillary     Status: None   Collection Time: 06/22/15  5:34 PM  Result Value Ref Range   Glucose-Capillary 92 65 - 99 mg/dL  Glucose, capillary     Status: Abnormal   Collection Time: 06/22/15  7:10 PM  Result Value Ref Range   Glucose-Capillary 116 (H) 65 - 99 mg/dL  Troponin I (q 6hr x 3)     Status: Abnormal   Collection Time: 06/22/15  9:16 PM  Result Value Ref Range   Troponin I 1.09 (H)  <0.031 ng/mL  Hemoglobin     Status: Abnormal   Collection Time: 06/22/15 10:53 PM  Result Value Ref Range   Hemoglobin 7.2 (L) 13.0 - 18.0 g/dL  Prepare RBC     Status: None   Collection Time: 06/22/15 11:59 PM  Result Value Ref Range   Order Confirmation ORDER PROCESSED BY BLOOD BANK   Glucose, capillary     Status: Abnormal   Collection Time: 06/22/15 11:59 PM  Result Value Ref Range   Glucose-Capillary 108 (H) 65 - 99 mg/dL   No results found.   ASSESSMENT AND PLAN: Mildly elevated troponin due to demand ischemia secondary to severe anemia with history of coronary artery disease. No acute EKG changes and denies chest pain at this time. Advise getting echocardiogram to look at wall motion. If patient needs EGD or colonoscopy advise proceeding with the procedure. Elevated troponin is most likely secondary to demand ischemia due to severe anemia.  Pippa Hanif A

## 2015-06-24 ENCOUNTER — Inpatient Hospital Stay: Payer: Medicare Other

## 2015-06-24 LAB — CBC
HCT: 31.1 % — ABNORMAL LOW (ref 40.0–52.0)
Hemoglobin: 10 g/dL — ABNORMAL LOW (ref 13.0–18.0)
MCH: 27.2 pg (ref 26.0–34.0)
MCHC: 32.3 g/dL (ref 32.0–36.0)
MCV: 84.3 fL (ref 80.0–100.0)
PLATELETS: 84 10*3/uL — AB (ref 150–440)
RBC: 3.69 MIL/uL — ABNORMAL LOW (ref 4.40–5.90)
RDW: 17.5 % — ABNORMAL HIGH (ref 11.5–14.5)
WBC: 5.7 10*3/uL (ref 3.8–10.6)

## 2015-06-24 LAB — GLUCOSE, CAPILLARY
GLUCOSE-CAPILLARY: 122 mg/dL — AB (ref 65–99)
GLUCOSE-CAPILLARY: 100 mg/dL — AB (ref 65–99)
Glucose-Capillary: 109 mg/dL — ABNORMAL HIGH (ref 65–99)
Glucose-Capillary: 111 mg/dL — ABNORMAL HIGH (ref 65–99)
Glucose-Capillary: 136 mg/dL — ABNORMAL HIGH (ref 65–99)
Glucose-Capillary: 85 mg/dL (ref 65–99)

## 2015-06-24 LAB — BASIC METABOLIC PANEL
ANION GAP: 0 — AB (ref 5–15)
BUN: 25 mg/dL — ABNORMAL HIGH (ref 6–20)
CALCIUM: 7.8 mg/dL — AB (ref 8.9–10.3)
CO2: 24 mmol/L (ref 22–32)
Chloride: 118 mmol/L — ABNORMAL HIGH (ref 101–111)
Creatinine, Ser: 0.93 mg/dL (ref 0.61–1.24)
GLUCOSE: 128 mg/dL — AB (ref 65–99)
Potassium: 4 mmol/L (ref 3.5–5.1)
Sodium: 142 mmol/L (ref 135–145)

## 2015-06-24 MED ORDER — PANTOPRAZOLE SODIUM 40 MG PO TBEC
40.0000 mg | DELAYED_RELEASE_TABLET | Freq: Two times a day (BID) | ORAL | Status: DC
  Administered 2015-06-24: 40 mg via ORAL
  Filled 2015-06-24: qty 1

## 2015-06-24 MED ORDER — NADOLOL 20 MG PO TABS: 20.0000 mg | ORAL_TABLET | Freq: Every day | ORAL | Status: DC

## 2015-06-24 NOTE — Progress Notes (Signed)
GI Inpatient Follow-up Note  Patient Identification: Mark Ford is a 70 y.o. male  With melena, esoph varices  Subjective:  No more stool today.  No abd pain. Tolerating clears well.  No f/c, no n/v.   Scheduled Inpatient Medications:  . allopurinol  300 mg Oral Daily  . antiseptic oral rinse  7 mL Mouth Rinse BID  . cefTRIAXone (ROCEPHIN)  IV  1 g Intravenous Q24H  . insulin aspart  0-9 Units Subcutaneous 6 times per day  . levothyroxine  100 mcg Oral QAC breakfast  . nadolol  20 mg Oral QHS  . pantoprazole  40 mg Oral BID  . simvastatin  40 mg Oral q1800  . sodium chloride  3 mL Intravenous Q12H    Continuous Inpatient Infusions:   . sodium chloride 20 mL/hr at 06/23/15 2028    PRN Inpatient Medications:  albuterol, Influenza vac split quadrivalent PF, morphine injection, ondansetron **OR** ondansetron (ZOFRAN) IV, sodium phosphate  Review of Systems: Constitutional: Weight is stable.  Eyes: No changes in vision. ENT: No oral lesions, sore throat.  GI: see HPI.  Heme/Lymph: No easy bruising.  CV: No chest pain.  GU: No hematuria.  Integumentary: No rashes.  Neuro: No headaches.  Psych: No depression/anxiety.  Endocrine: No heat/cold intolerance.  Allergic/Immunologic: No urticaria.  Resp: No cough, SOB.  Musculoskeletal: No joint swelling.    Physical Examination: BP 112/49 mmHg  Pulse 55  Temp(Src) 97.7 F (36.5 C) (Oral)  Resp 18  Ht 5\' 7"  (1.702 m)  Wt 90.22 kg (198 lb 14.4 oz)  BMI 31.14 kg/m2  SpO2 95% Gen: NAD, alert and oriented x 4 Neck: supple, no JVD or thyromegaly Chest: CTA bilaterally, no wheezes, crackles, or other adventitious sounds CV: RRR, no m/g/c/r Abd: soft, NT, ND, +BS in all four quadrants; no HSM, guarding, ridigity, or rebound tenderness Ext: no edema, well perfused with 2+ pulses, Skin: no rash or lesions noted Lymph: no LAD  Data: Lab Results  Component Value Date   WBC 5.7 06/24/2015   HGB 10.0* 06/24/2015   HCT  31.1* 06/24/2015   MCV 84.3 06/24/2015   PLT 84* 06/24/2015    Recent Labs Lab 06/22/15 2253 06/23/15 0911 06/24/15 0636  HGB 7.2* 8.9* 10.0*   Lab Results  Component Value Date   NA 142 06/24/2015   K 4.0 06/24/2015   CL 118* 06/24/2015   CO2 24 06/24/2015   BUN 25* 06/24/2015   CREATININE 0.93 06/24/2015   Lab Results  Component Value Date   ALT 15* 06/23/2015   AST 30 06/23/2015   ALKPHOS 34* 06/23/2015   BILITOT 1.0 06/23/2015    Recent Labs Lab 06/22/15 1027  APTT 34  INR 1.58   Assessment/Plan: Mr. Huel Cote is a 70 y.o. male with melena likely from variceal bleed, s/p banding 06/22/14.  Doing well now.  No stool. Hgb stable.   Recommendations: - cont nadolol 40 mg daily, titrate to HR 60 - cont abx for 7 day course - d/c octreotide drip - mech soft for 7 days, then regular diet ok - no NG tube for 7 days - possible d/c tomorrow.  - GI clinic f/u in 2 weeks - repeat EGD in 4 weeks  Please call with questions or concerns.  REIN, Grace Blight, MD

## 2015-06-24 NOTE — Progress Notes (Signed)
SUBJECTIVE: Patient is feeling much better   Filed Vitals:   06/23/15 1844 06/23/15 2012 06/24/15 0500 06/24/15 0513  BP: 133/59 133/58  102/51  Pulse: 78 80  55  Temp:  97.6 F (36.4 C)  97.9 F (36.6 C)  TempSrc:  Oral  Oral  Resp: 22 22  17   Height:      Weight:   198 lb 14.4 oz (90.22 kg)   SpO2: 98% 96%  92%    Intake/Output Summary (Last 24 hours) at 06/24/15 0854 Last data filed at 06/24/15 0500  Gross per 24 hour  Intake 1190.42 ml  Output   1000 ml  Net 190.42 ml    LABS: Basic Metabolic Panel:  Recent Labs  06/22/15 1027 06/23/15 0352  NA 143 145  K 3.8 3.9  CL 110 115*  CO2 23 26  GLUCOSE 236* 119*  BUN 36* 27*  CREATININE 0.82 0.71  CALCIUM 9.2 8.4*   Liver Function Tests:  Recent Labs  06/22/15 1027 06/23/15 0352  AST 29 30  ALT 16* 15*  ALKPHOS 42 34*  BILITOT 0.9 1.0  PROT 5.9* 5.4*  ALBUMIN 3.6 3.2*   No results for input(s): LIPASE, AMYLASE in the last 72 hours. CBC:  Recent Labs  06/23/15 0911 06/24/15 0636  WBC 3.6* 5.7  HGB 8.9* 10.0*  HCT 27.0* 31.1*  MCV 82.8 84.3  PLT 73* 84*   Cardiac Enzymes:  Recent Labs  06/23/15 0352 06/23/15 0911 06/23/15 1520  TROPONINI 1.30* 1.20* 1.21*   BNP: Invalid input(s): POCBNP D-Dimer: No results for input(s): DDIMER in the last 72 hours. Hemoglobin A1C: No results for input(s): HGBA1C in the last 72 hours. Fasting Lipid Panel: No results for input(s): CHOL, HDL, LDLCALC, TRIG, CHOLHDL, LDLDIRECT in the last 72 hours. Thyroid Function Tests: No results for input(s): TSH, T4TOTAL, T3FREE, THYROIDAB in the last 72 hours.  Invalid input(s): FREET3 Anemia Panel: No results for input(s): VITAMINB12, FOLATE, FERRITIN, TIBC, IRON, RETICCTPCT in the last 72 hours.   PHYSICAL EXAM General: Well developed, well nourished, in no acute distress HEENT:  Normocephalic and atramatic Neck:  No JVD.  Lungs: Clear bilaterally to auscultation and percussion. Heart: HRRR . Normal S1  and S2 without gallops or murmurs.  Abdomen: Bowel sounds are positive, abdomen soft and non-tender  Msk:  Back normal, normal gait. Normal strength and tone for age. Extremities: No clubbing, cyanosis or edema.   Neuro: Alert and oriented X 3. Psych:  Good affect, responds appropriately  TELEMETRY: Sinus rhythm  ASSESSMENT AND PLAN: Mildly elevated troponin secondary to coronary artery disease with status post CABG. Patient had severe anemia which has resolved and is feeling much better now. Will do outpatient stress testing.  Active Problems:   GI bleed    Dionisio David, MD, [facility] 06/24/2015 8:54 AM

## 2015-06-24 NOTE — Progress Notes (Signed)
Patient ID: Mark Ford, male   DOB: 08-20-1945, 70 y.o.   MRN: [id number] [facility] PROGRESS NOTE  PCP: Maryland Pink, MD  HPI/Subjective: Patient feeling okay and offers no complaints. Found to have esophageal varices and portal gastropathy on EGD. Patient seen earlier no further bleeding at that time.  Objective: Filed Vitals:   06/24/15 0513 06/24/15 1308  BP: 102/51 112/49  Pulse: 55 55  Temp: 97.9 F (36.6 C) 97.7 F (36.5 C)  Resp: 17 18    Filed Weights   06/23/15 0500 06/23/15 1204 06/24/15 0500  Weight: 90.2 kg (198 lb 13.7 oz) 90.719 kg (200 lb) 90.22 kg (198 lb 14.4 oz)    ROS: Review of Systems  Constitutional: Negative for fever and chills.  Eyes: Negative for blurred vision.  Respiratory: Negative for cough and shortness of breath.   Cardiovascular: Negative for chest pain.  Gastrointestinal: Negative for nausea, vomiting, abdominal pain, diarrhea and constipation.  Genitourinary: Negative for dysuria.  Musculoskeletal: Negative for joint pain.  Neurological: Negative for dizziness and headaches.   Exam: Physical Exam  Constitutional: He is oriented to person, place, and time.  HENT:  Nose: No mucosal edema.  Mouth/Throat: No oropharyngeal exudate or posterior oropharyngeal edema.  Eyes: Conjunctivae, EOM and lids are normal. Pupils are equal, round, and reactive to light.  Neck: No JVD present. Carotid bruit is not present. No edema present. No thyroid mass and no thyromegaly present.  Cardiovascular: S1 normal and S2 normal.  Exam reveals no gallop.   No murmur heard. Pulses:      Dorsalis pedis pulses are 2+ on the right side, and 2+ on the left side.  Respiratory: No respiratory distress. He has no wheezes. He has no rhonchi. He has no rales.  GI: Soft. Bowel sounds are normal. There is no tenderness.  Musculoskeletal:       Right ankle: He exhibits swelling.       Left ankle: He exhibits swelling.  Lymphadenopathy:    He  has no cervical adenopathy.  Neurological: He is alert and oriented to person, place, and time. No cranial nerve deficit.  Skin: Skin is warm. No rash noted. Nails show no clubbing.  Psychiatric: He has a normal mood and affect.    Data Reviewed: Basic Metabolic Panel:  Recent Labs Lab 06/22/15 1027 06/23/15 0352 06/24/15 0636  NA 143 145 142  K 3.8 3.9 4.0  CL 110 115* 118*  CO2 23 26 24   GLUCOSE 236* 119* 128*  BUN 36* 27* 25*  CREATININE 0.82 0.71 0.93  CALCIUM 9.2 8.4* 7.8*   Liver Function Tests:  Recent Labs Lab 06/22/15 1027 06/23/15 0352  AST 29 30  ALT 16* 15*  ALKPHOS 42 34*  BILITOT 0.9 1.0  PROT 5.9* 5.4*  ALBUMIN 3.6 3.2*   CBC:  Recent Labs Lab 06/22/15 1027 06/22/15 2253 06/23/15 0911 06/24/15 0636  WBC 3.9  --  3.6* 5.7  HGB 6.3* 7.2* 8.9* 10.0*  HCT 20.3*  --  27.0* 31.1*  MCV 80.7  --  82.8 84.3  PLT 100*  --  73* 84*   Cardiac Enzymes:  Recent Labs Lab 06/22/15 1437 06/22/15 2116 06/23/15 0352 06/23/15 0911 06/23/15 1520  TROPONINI 0.71* 1.09* 1.30* 1.20* 1.21*    CBG:  Recent Labs Lab 06/23/15 2013 06/24/15 0019 06/24/15 0512 06/24/15 0756 06/24/15 1205  GLUCAP 163* 111* 122* 100* 136*    Recent Results (from the past 240 hour(s))  MRSA PCR Screening  Status: None   Collection Time: 06/22/15  2:56 PM  Result Value Ref Range Status   MRSA by PCR NEGATIVE NEGATIVE Final    Comment:        The GeneXpert MRSA Assay (FDA approved for NASAL specimens only), is one component of a comprehensive MRSA colonization surveillance program. It is not intended to diagnose MRSA infection nor to guide or monitor treatment for MRSA infections.      Scheduled Meds: . allopurinol  300 mg Oral Daily  . antiseptic oral rinse  7 mL Mouth Rinse BID  . cefTRIAXone (ROCEPHIN)  IV  1 g Intravenous Q24H  . insulin aspart  0-9 Units Subcutaneous 6 times per day  . levothyroxine  100 mcg Oral QAC breakfast  . nadolol  20 mg  Oral QHS  . octreotide  50 mcg Intravenous Once  . pantoprazole  40 mg Oral BID  . simvastatin  40 mg Oral q1800  . sodium chloride  3 mL Intravenous Q12H   Continuous Infusions: . sodium chloride 20 mL/hr at 06/23/15 2028  . octreotide  (SANDOSTATIN)    IV infusion 50 mcg/hr (06/24/15 0751)    Assessment/Plan:  1. Acute blood loss anemia. 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 Patient is on Protonix (now orally) and octreotide drip. In history listed as cryptogenic cirrhosis. Patient states he does not drink alcohol. Hemoglobin this morning up at 10.0. Advance diet to full liquid diet. 2. History of esophageal cancer status post radiation 3. Type 2 diabetes mellitus on sliding scale 4. Hypothyroidism unspecified can restart levothyroxine 5. Elevated troponin demand ischemia from severe anemia 6. Hyperlipidemia unspecified- restart simvastatin 7. History of gout on allopurinol 8. We'll get physical therapy evaluation  Code Status:     Code Status Orders        Start     Ordered   06/22/15 1233  Full code   Continuous     06/22/15 1233    Code Status History    Date Active Date Inactive Code Status Order ID Comments User Context   This patient has a current code status but no historical code status.     Disposition Plan: Potentially home late Friday or over the weekend.  Consultants:  Cardiology  Gastroenterology  Time spent: 25 minutes  Loletha Grayer  [facility]

## 2015-06-24 NOTE — Care Management Important Message (Signed)
Important Message  Patient Details  Name: Mark Ford MRN: [id number] Date of Birth: Jun 16, 1945   Medicare Important Message Given:  Yes    Juliann Pulse A Thomson Herbers 06/24/2015, 2:51 PM

## 2015-06-25 LAB — TYPE AND SCREEN
ABO/RH(D): O POS
ANTIBODY SCREEN: NEGATIVE
UNIT DIVISION: 0
UNIT DIVISION: 0
Unit division: 0
Unit division: 0
Unit division: 0
Unit division: 0

## 2015-06-25 LAB — GLUCOSE, CAPILLARY
GLUCOSE-CAPILLARY: 129 mg/dL — AB (ref 65–99)
GLUCOSE-CAPILLARY: 110 mg/dL — AB (ref 65–99)
Glucose-Capillary: 110 mg/dL — ABNORMAL HIGH (ref 65–99)
Glucose-Capillary: 139 mg/dL — ABNORMAL HIGH (ref 65–99)

## 2015-06-25 MED ORDER — CEPHALEXIN 500 MG PO CAPS: 500.0000 mg | ORAL_CAPSULE | Freq: Three times a day (TID) | ORAL | Status: DC

## 2015-06-25 MED ORDER — PANTOPRAZOLE SODIUM 40 MG PO PACK: 20.0000 mg | PACK | Freq: Every day | ORAL | Status: DC

## 2015-06-25 MED ORDER — HEPARIN SOD (PORK) LOCK FLUSH 100 UNIT/ML IV SOLN
500.0000 [IU] | Freq: Once | INTRAVENOUS | Status: AC
  Administered 2015-06-25: 500 [IU] via INTRAVENOUS
  Filled 2015-06-25: qty 5

## 2015-06-25 MED ORDER — NADOLOL 20 MG PO TABS: ORAL_TABLET | ORAL | Status: AC

## 2015-06-25 MED ORDER — PANTOPRAZOLE SODIUM 40 MG PO TBEC: 40.0000 mg | DELAYED_RELEASE_TABLET | Freq: Two times a day (BID) | ORAL | Status: AC

## 2015-06-25 NOTE — Discharge Summary (Signed)
[place] [facility] [place] NAME: Mark Ford    MR#:  [id number]  DATE OF BIRTH:  1946/03/21  DATE OF ADMISSION:  06/22/2015 ADMITTING PHYSICIAN: Hillary Bow, MD  DATE OF DISCHARGE: 06/25/2015  2:09 PM  PRIMARY CARE PHYSICIAN: Maryland Pink, MD    ADMISSION DIAGNOSIS:  Anemia associated with acute blood loss [D62] Gastrointestinal hemorrhage with melena [K92.1]  DISCHARGE DIAGNOSIS:  Active Problems:   GI bleed   SECONDARY DIAGNOSIS:   Past Medical History  Diagnosis Date  . 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 GERD (gastroesophageal reflux disease)   . Diabetes mellitus without complication ([place])   . Thyroid disease     hypothroidism  . CAD (coronary artery disease)   . S/P CABG (coronary artery bypass graft)   . Hyperlipidemia   . 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 Esophageal varices ([place])     HOSPITAL COURSE:   1. Acute blood loss anemia. 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 Patient was on Protonix and octreotide drips. Patient has a history of cryptogenic cirrhosis and his history. The patient does not drink alcohol. Hemoglobin stable at 10 and no further bleeding. Patient tolerating soft diet. Protonix prescribed upon discharge home. He was advised to stay away from any anti-inflammatories including aspirin and Advil Motrin or Aleve. Case also discussed with his wife. Follow-up with Dr. Rayann Heman gastroenterology as outpatient in 2 weeks. One week of antibiotic given 2. Cryptogenic cirrhosis with history. Nadolol prescribed 20 mg half tablet daily at bedtime. His heart rate was in the 50s on 20 mg. 3. History of esophageal cancer status post radiation 4. Type 2 diabetes mellitus can go back on oral medications 5. Hypothyroidism unspecified on levothyroxin 6. Elevated troponin demand ischemia from severe anemia follow-up with Dr. Humphrey Rolls cardiology as outpatient.  7 history of gout  on allopurinol.   DISCHARGE CONDITIONS:   Satisfactory  CONSULTS OBTAINED:  Treatment Team:  Dionisio David, MD  DRUG ALLERGIES:   Allergies  Allergen Reactions  . Shellfish Allergy Other (See Comments)    GOUT  . Tylenol [Acetaminophen] Other (See Comments)    Not allergic, but was told not to take it any more due to his liver condition.     DISCHARGE MEDICATIONS:   Discharge Medication List as of 06/25/2015 12:55 PM    START taking these medications   Details  cephALEXin (KEFLEX) 500 MG capsule Take 1 capsule (500 mg total) by mouth 3 (three) times daily., Starting 06/25/2015, Until Discontinued, Print    nadolol (CORGARD) 20 MG tablet 1/2 tablet orally every evening, Print    pantoprazole (PROTONIX) 40 MG tablet Take 1 tablet (40 mg total) by mouth 2 (two) times daily., Starting 06/25/2015, Until Discontinued, Print      CONTINUE these medications which have NOT CHANGED   Details  allopurinol (ZYLOPRIM) 300 MG tablet Take 300 mg by mouth daily. Take in the morning, Until Discontinued, Historical Med    glipiZIDE (GLUCOTROL XL) 10 MG 24 hr tablet Take 10 mg by mouth daily with breakfast., Until Discontinued, Historical Med    levothyroxine (SYNTHROID, LEVOTHROID) 100 MCG tablet Take 100 mcg by mouth daily before breakfast. , Starting 08/24/2014, Until Discontinued, Historical Med    metFORMIN (GLUCOPHAGE) 1000 MG tablet Take 1,000 mg by mouth 2 (two) times daily with a meal., Until Discontinued, Historical Med    simvastatin (ZOCOR) 40 MG tablet Take 40  mg by mouth daily. Take at bedtime, Until Discontinued, Historical Med      STOP taking these medications     hydrocortisone (ANUSOL-HC) 25 MG suppository      tobramycin (TOBREX) 0.3 % ophthalmic solution          DISCHARGE INSTRUCTIONS:   Follow-up with Dr. Rayann Heman gastroenterology 2 weeks, PCP one week, Dr. Humphrey Rolls cardiology as outpatient  If you experience worsening of your admission symptoms, develop  shortness of breath, life threatening emergency, suicidal or homicidal thoughts you must seek medical attention immediately by calling 911 or calling your MD immediately  if symptoms less severe.  You Must read complete instructions/literature along with all the possible adverse reactions/side effects for all the Medicines you take and that have been prescribed to you. Take any new Medicines after you have completely understood and accept all the possible adverse reactions/side effects.   Please note  You were cared for by a hospitalist during your hospital stay. If you have any questions about your discharge medications or the care you received while you were in the hospital after you are discharged, you can call the unit and asked to speak with the hospitalist on call if the hospitalist that took care of you is not available. Once you are discharged, your primary care physician will handle any further medical issues. Please note that NO REFILLS for any discharge medications will be authorized once you are discharged, as it is imperative that you return to your primary care physician (or establish a relationship with a primary care physician if you do not have one) for your aftercare needs so that they can reassess your need for medications and monitor your lab values.    Today   CHIEF COMPLAINT:   Chief Complaint  Patient presents with  . GI Bleeding    HISTORY OF PRESENT ILLNESS:  Mark Ford  is a 70 y.o. male presented with upper GI bleeding.   VITAL SIGNS:  Blood pressure 112/51, pulse 54, temperature 98.1 F (36.7 C), temperature source Oral, resp. rate 17, height 5\' 7"  (1.702 m), weight 97.024 kg (213 lb 14.4 oz), SpO2 96 %.    PHYSICAL EXAMINATION:  GENERAL:  70 y.o.-year-old patient lying in the bed with no acute distress.  EYES: Pupils equal, round, reactive to light and accommodation. No scleral icterus. Extraocular muscles intact.  HEENT: Head atraumatic,  normocephalic. Oropharynx and nasopharynx clear.  NECK:  Supple, no jugular venous distention. No thyroid enlargement, no tenderness.  LUNGS: Normal breath sounds bilaterally, no wheezing, rales,rhonchi or crepitation. No use of accessory muscles of respiration.  CARDIOVASCULAR: S1, S2 normal. No murmurs, rubs, or gallops.  ABDOMEN: Soft, non-tender, non-distended. Bowel sounds present. No organomegaly or mass.  EXTREMITIES: No pedal edema, cyanosis, or clubbing.  NEUROLOGIC: Cranial nerves II through XII are intact. Muscle strength 5/5 in all extremities. Sensation intact. Gait not checked.  PSYCHIATRIC: The patient is alert and oriented x 3.  SKIN: No obvious rash, lesion, or ulcer.   DATA REVIEW:   CBC  Recent Labs Lab 06/24/15 0636  WBC 5.7  HGB 10.0*  HCT 31.1*  PLT 84*    Chemistries   Recent Labs Lab 06/23/15 0352 06/24/15 0636  NA 145 142  K 3.9 4.0  CL 115* 118*  CO2 26 24  GLUCOSE 119* 128*  BUN 27* 25*  CREATININE 0.71 0.93  CALCIUM 8.4* 7.8*  AST 30  --   ALT 15*  --   ALKPHOS 34*  --  BILITOT 1.0  --     Cardiac Enzymes  Recent Labs Lab 06/23/15 1520  TROPONINI 1.21*    Microbiology Results  Results for orders placed or performed during the hospital encounter of 06/22/15  MRSA PCR Screening     Status: None   Collection Time: 06/22/15  2:56 PM  Result Value Ref Range Status   MRSA by PCR NEGATIVE NEGATIVE Final    Comment:        The GeneXpert MRSA Assay (FDA approved for NASAL specimens only), is one component of a comprehensive MRSA colonization surveillance program. It is not intended to diagnose MRSA infection nor to guide or monitor treatment for MRSA infections.     RADIOLOGY:  US Abdomen Limited  06/24/2015  CLINICAL DATA:  Cirrhosis, evaluate for ascites, history of esophageal cancer EXAM: LIMITED ABDOMEN ULTRASOUND FOR ASCITES TECHNIQUE: Limited ultrasound survey for ascites was performed in all four abdominal quadrants.  COMPARISON:  PET-CT dated 12/02/2014 FINDINGS: Survey imaging of all 4 abdominal quadrants was performed. Small amount of ascites is visualized, predominantly in the right mid abdomen. IMPRESSION: Small amount of abdominal ascites. Electronically Signed   By: Julian Hy M.D.   On: 06/24/2015 09:36   Management plans discussed with the patient, family and they are in agreement.  CODE STATUS:     Code Status Orders        Start     Ordered   06/22/15 1233  Full code   Continuous     06/22/15 1233    Code Status History    Date Active Date Inactive Code Status Order ID Comments User Context   This patient has a current code status but no historical code status.      TOTAL TIME TAKING CARE OF THIS PATIENT:  35 minutes.    Loletha Grayer M.D on 06/25/2015 at 5:11 PM  Between 7am to 6pm - Pager - [phone]  After 6pm go to [email or web address] - password [email or web address] [facility]  [facility]  Office  [phone]  CC: Primary care physician; Maryland Pink, MD

## 2015-06-25 NOTE — Progress Notes (Signed)
SUBJECTIVE: No chest pain or shortness of breath   Filed Vitals:   06/24/15 1308 06/24/15 2118 06/25/15 0429 06/25/15 0500  BP: 112/49 116/51 112/51   Pulse: 55 57 54   Temp: 97.7 F (36.5 C) 98.4 F (36.9 C) 98.1 F (36.7 C)   TempSrc: Oral Oral Oral   Resp: 18 17 17    Height:      Weight:    213 lb 14.4 oz (97.024 kg)  SpO2: 95% 94% 96%     Intake/Output Summary (Last 24 hours) at 06/25/15 0910 Last data filed at 06/25/15 0429  Gross per 24 hour  Intake    480 ml  Output    550 ml  Net    -70 ml    LABS: Basic Metabolic Panel:  Recent Labs  06/23/15 0352 06/24/15 0636  NA 145 142  K 3.9 4.0  CL 115* 118*  CO2 26 24  GLUCOSE 119* 128*  BUN 27* 25*  CREATININE 0.71 0.93  CALCIUM 8.4* 7.8*   Liver Function Tests:  Recent Labs  06/22/15 1027 06/23/15 0352  AST 29 30  ALT 16* 15*  ALKPHOS 42 34*  BILITOT 0.9 1.0  PROT 5.9* 5.4*  ALBUMIN 3.6 3.2*   No results for input(s): LIPASE, AMYLASE in the last 72 hours. CBC:  Recent Labs  06/23/15 0911 06/24/15 0636  WBC 3.6* 5.7  HGB 8.9* 10.0*  HCT 27.0* 31.1*  MCV 82.8 84.3  PLT 73* 84*   Cardiac Enzymes:  Recent Labs  06/23/15 0352 06/23/15 0911 06/23/15 1520  TROPONINI 1.30* 1.20* 1.21*   BNP: Invalid input(s): POCBNP D-Dimer: No results for input(s): DDIMER in the last 72 hours. Hemoglobin A1C: No results for input(s): HGBA1C in the last 72 hours. Fasting Lipid Panel: No results for input(s): CHOL, HDL, LDLCALC, TRIG, CHOLHDL, LDLDIRECT in the last 72 hours. Thyroid Function Tests: No results for input(s): TSH, T4TOTAL, T3FREE, THYROIDAB in the last 72 hours.  Invalid input(s): FREET3 Anemia Panel: No results for input(s): VITAMINB12, FOLATE, FERRITIN, TIBC, IRON, RETICCTPCT in the last 72 hours.   PHYSICAL EXAM General: Well developed, well nourished, in no acute distress HEENT:  Normocephalic and atramatic Neck:  No JVD.  Lungs: Clear bilaterally to auscultation and  percussion. Heart: HRRR . Normal S1 and S2 without gallops or murmurs.  Abdomen: Bowel sounds are positive, abdomen soft and non-tender  Msk:  Back normal, normal gait. Normal strength and tone for age. Extremities: No clubbing, cyanosis or edema.   Neuro: Alert and oriented X 3. Psych:  Good affect, responds appropriately  TELEMETRY: Not on monitor  ASSESSMENT AND PLAN: Anemia with history of GI bleed, status post blood transfusion. Elevated troponin with history of CABG three-vessel in 2011 at [facility]. Will do outpatient stress tests and have a follow-up in the office on next Friday at 2:30 PM   Active Problems:   GI bleed    Mark Ford A, MD, [facility] 06/25/2015 9:10 AM

## 2015-06-25 NOTE — Discharge Instructions (Addendum)
Esophageal Varices The esophagus is the passage that connects the throat to the stomach. Esophageal varices are blood vessels in the esophagus that have become enlarged. They develop when extra blood is forced to flow through them because the blood's normal pathway is blocked. Without treatment these blood vessels eventually break and bleed (hemorrhage). A hemorrhage is life-threatening. CAUSES This condition may be caused by:  Scarring of the liver (cirrhosis) due to alcoholism. This is the most common cause.  Liver disease.  Severe heart failure.  A blood clot in the portal vein.  Sarcoidosis. This is an inflammatory disease that can affect the liver.  Schistosomiasis. This is a parasitic infection that can cause liver damage. SYMPTOMS Usually there are no symptoms unless the esophageal varices bleed. Symptoms of bleeding esophageal varices include:  Vomiting material that is bright red or that is black and looks like coffee grounds.  Coughing up blood.  Black, tarry stools.  Dizziness or lightheadedness.  Low blood pressure.  Loss of consciousness. DIAGNOSIS This condition is diagnosed with tests, such as:  Endoscopy. During this test a thin, lighted tube is inserted through the mouth and into the esophagus.  Blood tests. These may be done to check liver function, blood counts, and the body's ability to form blood clots. TREATMENT This condition may be treated with:  Medicines that reduce pressure in the esophageal varices and reduce the risk of bleeding.  Procedures to reduce pressure in the esophageal varices and reduce the risk of bleeding or stop bleeding. These include:  Variceal ligation. In this procedure, a rubber band is placed around the esophageal varices to keep them from bleeding.  Injection therapy. This treatment involves an injection that causes the esophageal varices to shrink and close (sclerotherapy). Medicines that tighten blood vessels or alter  blood flow may also be used.  Balloon tamponade. In this procedure, a tube is put into the esophagus and a balloon is passed through it and inflated.  Transjugular intrahepatic portosystemic shunt (TIPS) placement. In this procedure, a small tube is placed within the liver veins. This decreases blood flow and pressure to the esophageal varices.  A liver transplant. This may be done if other treatments do not work. HOME CARE INSTRUCTIONS  Take medicines only as directed by your health care provider.  Follow your health care provider's instructions about rest and physical activity. SEEK IMMEDIATE MEDICAL CARE IF:  You have any symptoms of this condition after treatment.  You are unable to eat or drink.  You have chest pain.   This information is not intended to replace advice given to you by your health care provider. Make sure you discuss any questions you have with your health care provider.   Document Released: 08/19/2003 Document Revised: 10/13/2014 Document Reviewed: 05/25/2014 Elsevier Interactive Patient Education 2016 [employer]  Gastrointestinal Bleeding Scan A gastrointestinal bleeding scan is a procedure used to locate sites of bleeding in the bowel or abdomen. You may need this scan if you have symptoms of gastrointestinal bleeding, such as blood in your stool (feces). The scan may also be used before a surgery done to correct bleeding in these areas. It helps the surgeon find the location of bleeding. In this procedure, a small amount of a radioactive material (tracer) is injected into your blood. A scanner with a camera that detects the radioactive tracer is used to see if any of the material gets into your abdomen or bowel. If it does, this indicates the site of bleeding. LET YOUR  HEALTH CARE PROVIDER KNOW ABOUT:   Any allergies you have.  All medicines you are taking, including vitamins, herbs, eye drops, creams, and over-the-counter medicines.  Any blood disorders  you have.  Previous surgeries you have had.  Medical conditions you have.  If you are pregnant or think you may be pregnant.  If you are breastfeeding. RISKS AND COMPLICATIONS  Generally, this is a safe procedure. However, problems may occur, including:  Exposure to radiation (small amount).  Allergic reaction to the radioactive material. This is rare. BEFORE THE PROCEDURE   Ask your health care provider about changing or stopping your regular medicines. This is especially important if you are taking diabetes medicines or blood thinners. PROCEDURE   An IV tube will be inserted into one of your veins.  A small amount of radioactive tracer will be injected through the tube. In some cases, some of your blood may be drawn and mixed with the radioactive tracer before it is injected through the tube.  As the radioactive tracer travels through your bloodstream, images of your abdominal area will be taken. These images will be taken at short intervals of 5-15 minutes during the procedure. If the images show radioactive tracer in the abdomen or bowel, this indicates the site of bleeding.  Additional images may be taken every hour for up to 24 hours after the injection of the radioactive tracer. This may be needed to help identify a site of slow bleeding or bleeding that comes and goes. The procedure may vary among health care providers and hospitals. AFTER THE PROCEDURE  Return to your normal activities and diet as directed by your health care provider.  The radioactive tracer will leave your body over the next few days. Drink enough fluid to keep your urine clear or pale yellow. This will help flush the tracer out of your body.  It is your responsibility to obtain your test results. Ask your health care provider or the department performing the test when and how you will get your results.   This information is not intended to replace advice given to you by your health care provider. Make  sure you discuss any questions you have with your health care provider.   Document Released: 07/18/2005 Document Revised: 06/19/2014 Document Reviewed: 03/10/2014 Elsevier Interactive Patient Education 2016 [place] not take any aspirin or anti- inflammatory medication (advil, motrin, alleve) Soft diet one week

## 2015-06-25 NOTE — Progress Notes (Signed)
06/25/2015 2:06 PM  Beatris Si to be D/C'd Home per MD order.  Discussed prescriptions and follow up appointments with the patient. Prescriptions given to patient, medication list explained in detail. Pt verbalized understanding.    Medication List    STOP taking these medications        hydrocortisone 25 MG suppository  Commonly known as:  ANUSOL-HC     tobramycin 0.3 % ophthalmic solution  Commonly known as:  TOBREX      TAKE these medications        allopurinol 300 MG tablet  Commonly known as:  ZYLOPRIM  Take 300 mg by mouth daily. Take in the morning     cephALEXin 500 MG capsule  Commonly known as:  KEFLEX  Take 1 capsule (500 mg total) by mouth 3 (three) times daily.     glipiZIDE 10 MG 24 hr tablet  Commonly known as:  GLUCOTROL XL  Take 10 mg by mouth daily with breakfast.     levothyroxine 100 MCG tablet  Commonly known as:  SYNTHROID, LEVOTHROID  Take 100 mcg by mouth daily before breakfast.     metFORMIN 1000 MG tablet  Commonly known as:  GLUCOPHAGE  Take 1,000 mg by mouth 2 (two) times daily with a meal.     nadolol 20 MG tablet  Commonly known as:  CORGARD  1/2 tablet orally every evening     pantoprazole 40 MG tablet  Commonly known as:  PROTONIX  Take 1 tablet (40 mg total) by mouth 2 (two) times daily.     simvastatin 40 MG tablet  Commonly known as:  ZOCOR  Take 40 mg by mouth daily. Take at bedtime        Filed Vitals:   06/24/15 2118 06/25/15 0429  BP: 116/51 112/51  Pulse: 57 54  Temp: 98.4 F (36.9 C) 98.1 F (36.7 C)  Resp: 17 17    Skin clean, dry and intact without evidence of skin break down, no evidence of skin tears noted. IV catheter discontinued intact. Chest port heparin flushed and de accessed.  Site without signs and symptoms of complications. Dressing and pressure applied. Pt denies pain at this time. No complaints noted.  An After Visit Summary was printed and given to the patient. Patient escorted via [place],  and D/C home via private auto.  Dola Argyle

## 2015-06-28 ENCOUNTER — Encounter: Payer: Self-pay | Admitting: Gastroenterology

## 2015-06-30 ENCOUNTER — Ambulatory Visit
Admission: RE | Admit: 2015-06-30 | Discharge: 2015-06-30 | Disposition: A | Discharge status: Home / Self Care | Payer: Medicare Other | Source: Ambulatory Visit | Attending: Oncology | Admitting: Oncology

## 2015-06-30 ENCOUNTER — Inpatient Hospital Stay: Payer: Medicare Other

## 2015-06-30 DIAGNOSIS — E785 Hyperlipidemia, unspecified: Secondary | ICD-10-CM | POA: Diagnosis not present

## 2015-06-30 DIAGNOSIS — R531 Weakness: Secondary | ICD-10-CM | POA: Diagnosis not present

## 2015-06-30 DIAGNOSIS — E119 Type 2 diabetes mellitus without complications: Secondary | ICD-10-CM | POA: Diagnosis not present

## 2015-06-30 DIAGNOSIS — Z8719 Personal history of other diseases of the digestive system: Secondary | ICD-10-CM | POA: Diagnosis not present

## 2015-06-30 DIAGNOSIS — K7460 Unspecified cirrhosis of liver: Secondary | ICD-10-CM | POA: Diagnosis not present

## 2015-06-30 DIAGNOSIS — I6521 Occlusion and stenosis of right carotid artery: Secondary | ICD-10-CM | POA: Diagnosis not present

## 2015-06-30 DIAGNOSIS — J90 Pleural effusion, not elsewhere classified: Secondary | ICD-10-CM | POA: Insufficient documentation

## 2015-06-30 DIAGNOSIS — R590 Localized enlarged lymph nodes: Secondary | ICD-10-CM | POA: Diagnosis not present

## 2015-06-30 DIAGNOSIS — D696 Thrombocytopenia, unspecified: Secondary | ICD-10-CM | POA: Diagnosis not present

## 2015-06-30 DIAGNOSIS — Z8501 Personal history of malignant neoplasm of esophagus: Secondary | ICD-10-CM | POA: Diagnosis not present

## 2015-06-30 DIAGNOSIS — Z7984 Long term (current) use of oral hypoglycemic drugs: Secondary | ICD-10-CM | POA: Diagnosis not present

## 2015-06-30 DIAGNOSIS — Z923 Personal history of irradiation: Secondary | ICD-10-CM | POA: Diagnosis not present

## 2015-06-30 DIAGNOSIS — D72819 Decreased white blood cell count, unspecified: Secondary | ICD-10-CM | POA: Diagnosis not present

## 2015-06-30 DIAGNOSIS — Z79899 Other long term (current) drug therapy: Secondary | ICD-10-CM | POA: Diagnosis not present

## 2015-06-30 DIAGNOSIS — R5383 Other fatigue: Secondary | ICD-10-CM | POA: Diagnosis not present

## 2015-06-30 DIAGNOSIS — R188 Other ascites: Secondary | ICD-10-CM | POA: Diagnosis not present

## 2015-06-30 DIAGNOSIS — I2510 Atherosclerotic heart disease of native coronary artery without angina pectoris: Secondary | ICD-10-CM | POA: Diagnosis not present

## 2015-06-30 DIAGNOSIS — E039 Hypothyroidism, unspecified: Secondary | ICD-10-CM | POA: Diagnosis not present

## 2015-06-30 DIAGNOSIS — K7689 Other specified diseases of liver: Secondary | ICD-10-CM | POA: Diagnosis not present

## 2015-06-30 DIAGNOSIS — D649 Anemia, unspecified: Secondary | ICD-10-CM | POA: Diagnosis not present

## 2015-06-30 DIAGNOSIS — C155 Malignant neoplasm of lower third of esophagus: Secondary | ICD-10-CM | POA: Diagnosis present

## 2015-06-30 DIAGNOSIS — Z9221 Personal history of antineoplastic chemotherapy: Secondary | ICD-10-CM | POA: Diagnosis not present

## 2015-06-30 DIAGNOSIS — K219 Gastro-esophageal reflux disease without esophagitis: Secondary | ICD-10-CM | POA: Diagnosis not present

## 2015-06-30 DIAGNOSIS — M47812 Spondylosis without myelopathy or radiculopathy, cervical region: Secondary | ICD-10-CM | POA: Insufficient documentation

## 2015-06-30 DIAGNOSIS — I8500 Esophageal varices without bleeding: Secondary | ICD-10-CM | POA: Diagnosis not present

## 2015-06-30 DIAGNOSIS — Z951 Presence of aortocoronary bypass graft: Secondary | ICD-10-CM | POA: Diagnosis not present

## 2015-06-30 DIAGNOSIS — R5381 Other malaise: Secondary | ICD-10-CM | POA: Diagnosis not present

## 2015-06-30 DIAGNOSIS — R600 Localized edema: Secondary | ICD-10-CM | POA: Diagnosis not present

## 2015-06-30 LAB — CBC WITH DIFFERENTIAL/PLATELET
Basophils Absolute: 0 10*3/uL (ref 0–0.1)
Basophils Relative: 1 %
Eosinophils Absolute: 0.1 10*3/uL (ref 0–0.7)
Eosinophils Relative: 2 %
HEMATOCRIT: 30 % — AB (ref 40.0–52.0)
HEMOGLOBIN: 9.7 g/dL — AB (ref 13.0–18.0)
LYMPHS ABS: 0.5 10*3/uL — AB (ref 1.0–3.6)
Lymphocytes Relative: 19 %
MCH: 27.2 pg (ref 26.0–34.0)
MCHC: 32.4 g/dL (ref 32.0–36.0)
MCV: 84 fL (ref 80.0–100.0)
MONOS PCT: 9 %
Monocytes Absolute: 0.2 10*3/uL (ref 0.2–1.0)
NEUTROS PCT: 69 %
Neutro Abs: 1.7 10*3/uL (ref 1.4–6.5)
Platelets: 72 10*3/uL — ABNORMAL LOW (ref 150–440)
RBC: 3.57 MIL/uL — ABNORMAL LOW (ref 4.40–5.90)
RDW: 18.4 % — ABNORMAL HIGH (ref 11.5–14.5)
WBC: 2.4 10*3/uL — ABNORMAL LOW (ref 3.8–10.6)

## 2015-06-30 LAB — BASIC METABOLIC PANEL
Anion gap: 3 — ABNORMAL LOW (ref 5–15)
BUN: 9 mg/dL (ref 6–20)
CHLORIDE: 107 mmol/L (ref 101–111)
CO2: 30 mmol/L (ref 22–32)
CREATININE: 0.86 mg/dL (ref 0.61–1.24)
Calcium: 8.8 mg/dL — ABNORMAL LOW (ref 8.9–10.3)
GFR calc Af Amer: >60 mL/min (ref >60)
GFR calc non Af Amer: >60 mL/min (ref >60)
Glucose, Bld: 112 mg/dL — ABNORMAL HIGH (ref 65–99)
Potassium: 4.1 mmol/L (ref 3.5–5.1)
Sodium: 140 mmol/L (ref 135–145)

## 2015-06-30 MED ORDER — IOHEXOL 300 MG/ML  SOLN
100.0000 mL | Freq: Once | INTRAMUSCULAR | Status: AC | PRN
  Administered 2015-06-30: 100 mL via INTRAVENOUS

## 2015-07-01 ENCOUNTER — Inpatient Hospital Stay: Payer: Medicare Other

## 2015-07-01 ENCOUNTER — Inpatient Hospital Stay (HOSPITAL_BASED_OUTPATIENT_CLINIC_OR_DEPARTMENT_OTHER): Payer: Medicare Other | Admitting: Oncology

## 2015-07-01 ENCOUNTER — Ambulatory Visit: Payer: Medicare Other | Admitting: Oncology

## 2015-07-01 VITALS — BP 115/63 | HR 65 | Temp 98.1°F | Resp 16 | Wt 220.2 lb

## 2015-07-01 DIAGNOSIS — Z8501 Personal history of malignant neoplasm of esophagus: Secondary | ICD-10-CM | POA: Diagnosis not present

## 2015-07-01 DIAGNOSIS — D72819 Decreased white blood cell count, unspecified: Secondary | ICD-10-CM

## 2015-07-01 DIAGNOSIS — Z923 Personal history of irradiation: Secondary | ICD-10-CM | POA: Diagnosis not present

## 2015-07-01 DIAGNOSIS — D649 Anemia, unspecified: Secondary | ICD-10-CM | POA: Diagnosis not present

## 2015-07-01 DIAGNOSIS — K219 Gastro-esophageal reflux disease without esophagitis: Secondary | ICD-10-CM

## 2015-07-01 DIAGNOSIS — Z9221 Personal history of antineoplastic chemotherapy: Secondary | ICD-10-CM | POA: Diagnosis not present

## 2015-07-01 DIAGNOSIS — C155 Malignant neoplasm of lower third of esophagus: Secondary | ICD-10-CM

## 2015-07-01 DIAGNOSIS — R5383 Other fatigue: Secondary | ICD-10-CM

## 2015-07-01 DIAGNOSIS — R5381 Other malaise: Secondary | ICD-10-CM

## 2015-07-01 DIAGNOSIS — D696 Thrombocytopenia, unspecified: Secondary | ICD-10-CM

## 2015-07-01 DIAGNOSIS — Z8719 Personal history of other diseases of the digestive system: Secondary | ICD-10-CM

## 2015-07-01 DIAGNOSIS — R531 Weakness: Secondary | ICD-10-CM

## 2015-07-01 DIAGNOSIS — R600 Localized edema: Secondary | ICD-10-CM

## 2015-07-01 LAB — CBC WITH DIFFERENTIAL/PLATELET
Basophils Absolute: 0 10*3/uL (ref 0–0.1)
Basophils Relative: 1 %
Eosinophils Absolute: 0.1 10*3/uL (ref 0–0.7)
Eosinophils Relative: 3 %
HEMATOCRIT: 29.8 % — AB (ref 40.0–52.0)
HEMOGLOBIN: 9.6 g/dL — AB (ref 13.0–18.0)
LYMPHS ABS: 0.5 10*3/uL — AB (ref 1.0–3.6)
LYMPHS PCT: 20 %
MCH: 26.8 pg (ref 26.0–34.0)
MCHC: 32.1 g/dL (ref 32.0–36.0)
MCV: 83.4 fL (ref 80.0–100.0)
MONOS PCT: 11 %
Monocytes Absolute: 0.3 10*3/uL (ref 0.2–1.0)
NEUTROS ABS: 1.8 10*3/uL (ref 1.4–6.5)
NEUTROS PCT: 65 %
Platelets: 76 10*3/uL — ABNORMAL LOW (ref 150–440)
RBC: 3.57 MIL/uL — AB (ref 4.40–5.90)
RDW: 18.4 % — ABNORMAL HIGH (ref 11.5–14.5)
WBC: 2.7 10*3/uL — AB (ref 3.8–10.6)

## 2015-07-01 LAB — IRON AND TIBC
IRON: 26 ug/dL — AB (ref 45–182)
SATURATION RATIOS: 7 % — AB (ref 17.9–39.5)
TIBC: 389 ug/dL (ref 250–450)
UIBC: 363 ug/dL

## 2015-07-01 LAB — SAMPLE TO BLOOD BANK

## 2015-07-01 LAB — FERRITIN: FERRITIN: 11 ng/mL — AB (ref 24–336)

## 2015-07-01 NOTE — Progress Notes (Signed)
Patient here to discuss CT results.  He was recently admitted in [facility] with hemoglobin of 6.3 and had to receive 4 units of blood before any improvement.  Did have a GI bleed that was treated by Dr. Rayann Heman.  Still having symptoms of weakness, feeling cold all the time, and new symptoms of lower extremity edema with rash.

## 2015-07-05 NOTE — Progress Notes (Signed)
[place]  Telephone:([phone]) [phone] [phone]) [phone]  ID: Mark Ford OB: Sep 21, 1945  MR#: [id number]  FTD#:[id number]  Patient Care Team: [place] Pink, MD as PCP - General (Family Medicine) Manya Silvas, MD (Gastroenterology)  CHIEF COMPLAINT:  Chief Complaint  Patient presents with  . Esophageal Cancer    INTERVAL HISTORY: Patient returns to clinic today for further evaluation and discussion of his CT scan results. He was recently admitted to the hospital with a GI bleed and a hemoglobin of 6.3. He subsequently received 4 units of packed red blood cells. He currently feels continued weakness and fatigue and states this is "the worst I have ever felt". He is persistently cold. He also has increased lower extremity edema. He has no neurologic complaints. He denies any fevers. He denies any weight loss. He has no chest pain or shortness of breath. He denies any constipation or diarrhea. He has no further melena or hematochezia. He denies any pain. He has no urinary complaints. Patient offers no further specific complaints today.   REVIEW OF SYSTEMS:   Review of Systems  Constitutional: Positive for malaise/fatigue. Negative for fever.  HENT: Negative.   Respiratory: Negative.   Cardiovascular: Negative.   Gastrointestinal: Negative.  Negative for blood in stool and melena.  Musculoskeletal: Negative.   Neurological: Positive for weakness.  Endo/Heme/Allergies: Does not bruise/bleed easily.    As per HPI. Otherwise, a complete review of systems is negatve.  PAST MEDICAL HISTORY: Past Medical History  Diagnosis Date  . Cancer [facility])     adenocarcinoma fo the [place]  . GERD (gastroesophageal reflux disease)   . Diabetes mellitus without complication ([place])   . Thyroid disease     hypothroidism  . CAD (coronary artery disease)   . S/P CABG (coronary artery bypass graft)   . Hyperlipidemia   . 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 Esophageal  varices ([place])     PAST SURGICAL HISTORY: Past Surgical History  Procedure Laterality Date  . Coronary artery bypass graft    . Esophagogastroduodenoscopy (egd) with propofol N/A 02/11/2015    Procedure: ESOPHAGOGASTRODUODENOSCOPY (EGD) WITH PROPOFOL;  Surgeon: Manya Silvas, MD;  Location: [place];  Service: Endoscopy;  Laterality: N/A;  . Esophagogastroduodenoscopy (egd) with propofol N/A 06/23/2015    Procedure: ESOPHAGOGASTRODUODENOSCOPY (EGD) WITH PROPOFOL;  Surgeon: Josefine Class, MD;  Location: [facility] ENDOSCOPY;  Service: Endoscopy;  Laterality: N/A;    FAMILY HISTORY Family History  Problem Relation Age of Onset  . Hypertension         ADVANCED DIRECTIVES:    HEALTH MAINTENANCE: Social History  Substance Use Topics  . Smoking status: Never Smoker   . Smokeless tobacco: Never Used  . Alcohol Use: No     Colonoscopy:  PAP:  Bone density:  Lipid panel:  Allergies  Allergen Reactions  . Shellfish Allergy Other (See Comments)    GOUT  . Tylenol [Acetaminophen] Other (See Comments)    Not allergic, but was told not to take it any more due to his liver condition.     Current Outpatient Prescriptions  Medication Sig Dispense Refill  . allopurinol (ZYLOPRIM) 300 MG tablet Take 300 mg by mouth daily. Take in the morning    . glipiZIDE (GLUCOTROL XL) 10 MG 24 hr tablet Take 10 mg by mouth daily with breakfast.    . levothyroxine (SYNTHROID, LEVOTHROID) 100 MCG tablet Take 100 mcg by mouth daily before breakfast.     . metFORMIN (GLUCOPHAGE) 1000  MG tablet Take 1,000 mg by mouth 2 (two) times daily with a meal.    . nadolol (CORGARD) 20 MG tablet 1/2 tablet orally every evening 15 tablet 0  . pantoprazole (PROTONIX) 40 MG tablet Take 1 tablet (40 mg total) by mouth 2 (two) times daily. 60 tablet 0  . simvastatin (ZOCOR) 40 MG tablet Take 40 mg by mouth daily. Take at bedtime    . cephALEXin (KEFLEX) 500 MG capsule Take 1 capsule (500 mg total) by mouth 3  (three) times daily. (Patient not taking: Reported on 07/01/2015) 12 capsule 0   No current facility-administered medications for this visit.   Facility-Administered Medications Ordered in Other Visits  Medication Dose Route Frequency Provider Last Rate Last Dose  . sodium chloride 0.9 % injection 10 mL  10 mL Intravenous PRN Lloyd Huger, MD   10 mL at 11/11/14 1045  . sodium chloride 0.9 % injection 10 mL  10 mL Intravenous PRN Lloyd Huger, MD   10 mL at 05/11/15 1104    OBJECTIVE: Filed Vitals:   07/01/15 0923  BP: 115/63  Pulse: 65  Temp: 98.1 F (36.7 C)  Resp: 16     Body mass index is 34.49 kg/(m^2).    ECOG FS:0 - Asymptomatic  General: Well-developed, well-nourished, no acute distress. Eyes: anicteric sclera. Neck: No palpable lymphadenopathy.  Lungs: Clear to auscultation bilaterally. Heart: Regular rate and rhythm. No rubs, murmurs, or gallops. Abdomen: Soft, nontender, nondistended. No organomegaly noted, normoactive bowel sounds. Musculoskeletal: No edema, cyanosis, or clubbing. Neuro: Alert, answering all questions appropriately. Cranial nerves grossly intact. Skin: No rashes or petechiae noted. Psych: Normal affect.   LAB RESULTS:  Lab Results  Component Value Date   NA 140 06/30/2015   K 4.1 06/30/2015   CL 107 06/30/2015   CO2 30 06/30/2015   GLUCOSE 112* 06/30/2015   BUN 9 06/30/2015   CREATININE 0.86 06/30/2015   CALCIUM 8.8* 06/30/2015   PROT 5.4* 06/23/2015   ALBUMIN 3.2* 06/23/2015   AST 30 06/23/2015   ALT 15* 06/23/2015   ALKPHOS 34* 06/23/2015   BILITOT 1.0 06/23/2015   GFRNONAA >60 06/30/2015   GFRAA >60 06/30/2015    Lab Results  Component Value Date   WBC 2.7* 07/01/2015   NEUTROABS 1.8 07/01/2015   HGB 9.6* 07/01/2015   HCT 29.8* 07/01/2015   MCV 83.4 07/01/2015   PLT 76* 07/01/2015     STUDIES: Ct Soft Tissue Neck W Contrast  06/30/2015  CLINICAL DATA:  Esophageal cancer.  Esophageal varices. EXAM: CT NECK  WITH CONTRAST TECHNIQUE: Multidetector CT imaging of the neck was performed using the standard protocol following the bolus administration of intravenous contrast. CONTRAST:  147m OMNIPAQUE IOHEXOL 300 MG/ML  SOLN COMPARISON:  PET scan 08/12/2014.  Chest CT from the same day. FINDINGS: Pharynx and larynx: No focal mucosal or submucosal lesions are present. The left true vocal cord is positioned somewhat laterally. There is no discrete lesion associated with the vocal cord. The tongue base is normal. Calcifications are present in the left palatine tonsil, likely representing a history of prior infection. The parapharyngeal fat is clear bilaterally. Salivary glands: The submandibular and parotid glands are normal bilaterally. Thyroid: The thyroid is atrophic without focal lesions. Lymph nodes: Somewhat ill-defined left supraclavicular lymph nodes measure up to 12 mm. These are more prominent than on the prior exam. There is stranding about these nodes no other significant cervical adenopathy is present. Vascular: A right IJ Port-A-Cath is in  place. Atherosclerotic calcifications are present within the carotid bifurcations bilaterally. There is a moderate left-sided stenosis relative to the more distal vessel. Mild to moderate narrowing is present on the right. Atherosclerotic calcifications are present at the aortic arch without significant stenosis of the great vessels. Limited intracranial: Negative Visualized orbits: Within normal limits. Mastoids and visualized paranasal sinuses: Mild mucosal thickening is present along the floor of the maxillary sinuses bilaterally. A polyp or mucous retention cyst is noted anteriorly on the left. Skeleton: Multilevel endplate degenerative changes are noted. Uncovertebral spurring is present at C2-3 bilaterally. There is fusion across the endplates at L8-4. There straightening of the normal cervical lordosis. Upper chest: Lung apices are clear. Bilateral pleural effusions are  evident, right greater than left. No significant mediastinal adenopathy is present. IMPRESSION: 1. Left supraclavicular ill-defined lymph nodes have increased in size. This is concerning for metastatic disease. There is some interval growth since the last PET scan. 2. No other significant cervical adenopathy or focal esophageal lesion. 3. Atherosclerosis with left greater than right carotid stenosis. 4. Multilevel spondylosis in the cervical spine. Electronically Signed   By: San Morelle M.D.   On: 06/30/2015 11:25   Ct Chest W Contrast  06/30/2015  CLINICAL DATA:  History of esophageal adenocarcinoma. EXAM: CT CHEST WITH CONTRAST TECHNIQUE: Multidetector CT imaging of the chest was performed during intravenous contrast administration. CONTRAST:  144m OMNIPAQUE IOHEXOL 300 MG/ML  SOLN COMPARISON:  None. FINDINGS: Mediastinum/Nodes: No axillary or supraclavicular adenopathy. No mediastinal hilar adenopathy. No pericardial fluid. There is thickening of the distal esophagus over a for 5 cm segment with single wall thickening to 10 mm. No paraesophageal lymphadenopathy. Lungs/Pleura: Bilateral pleural effusions. No suspicious nodules. Mild basilar atelectasis. Airways are normal. Upper abdomen: Small amount of ascites surrounds the liver and spleen. Liver has a nodular contour. Adrenal glands are normal. No gastrohepatic ligament lymphadenopathy. Stomach is grossly normal. Musculoskeletal: No aggressive osseous lesion. IMPRESSION: 1. No evidence of esophageal cancer recurrence or metastasis in the thorax. 2. Thickened distal esophagus without mass lesion. 3. Bilateral pleural effusions and ascites with nodular liver suggests cirrhosis. Electronically Signed   By: SSuzy BouchardM.D.   On: 06/30/2015 12:48   [place]Abdomen Limited  06/24/2015  CLINICAL DATA:  Cirrhosis, evaluate for ascites, history of esophageal cancer EXAM: LIMITED ABDOMEN ULTRASOUND FOR ASCITES TECHNIQUE: Limited ultrasound survey for  ascites was performed in all four abdominal quadrants. COMPARISON:  PET-CT dated 12/02/2014 FINDINGS: Survey imaging of all 4 abdominal quadrants was performed. Small amount of ascites is visualized, predominantly in the right mid abdomen. IMPRESSION: Small amount of abdominal ascites. Electronically Signed   By: SJulian HyM.D.   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  PLAN:    1. Esophageal adenocarcinoma: Patient completed his chemotherapy and concurrent XRT on October 09, 2014. CT scan of the chest reviewed independently and reported as above with left supraclavicular lymphadenopathy concerning for recurrence. It is possible this also could be reactive from his recent GI bleed. Will wait 3-4 weeks and repeat PET scan to assess for any interval change. Patient's EGD while in the hospital for GI bleed did not reveal any local recurrence. Previously because of patient's history CABG, he refused any additional chest surgery. Return to clinic one to 2 days after his imaging to discuss the results and treatment planning if necessary.  2. Thrombocytopenia: Stable. Likely multifactorial. Bone marrow biopsy was discussed with patient, but he is refusing  this procedure at this time. Monitor.  3. Anemia: Hemoglobin decreased, but stable since discharge from the hospital. Patient does not require transfusion at this time.  4. Leukopenia: Patient refusing bone marrow biopsy as above. Monitor.  Approximately 30 minutes was spent in discussion of which greater than 50% was consultation.  Patient expressed understanding and was in agreement with this plan. He also understands that He can call clinic at any time with any questions, concerns, or complaints.    Lloyd Huger, MD   07/05/2015 8:44 AM

## 2015-07-13 ENCOUNTER — Emergency Department: Payer: Medicare Other

## 2015-07-13 ENCOUNTER — Encounter: Payer: Self-pay | Admitting: Emergency Medicine

## 2015-07-13 ENCOUNTER — Emergency Department
Admission: EM | Admit: 2015-07-13 | Discharge: 2015-07-13 | Disposition: A | Discharge status: Home / Self Care | Payer: Medicare Other | Attending: Student | Admitting: Student

## 2015-07-13 DIAGNOSIS — R197 Diarrhea, unspecified: Secondary | ICD-10-CM | POA: Diagnosis present

## 2015-07-13 DIAGNOSIS — Z79899 Other long term (current) drug therapy: Secondary | ICD-10-CM | POA: Insufficient documentation

## 2015-07-13 DIAGNOSIS — Z7984 Long term (current) use of oral hypoglycemic drugs: Secondary | ICD-10-CM | POA: Diagnosis not present

## 2015-07-13 DIAGNOSIS — I509 Heart failure, unspecified: Secondary | ICD-10-CM | POA: Insufficient documentation

## 2015-07-13 DIAGNOSIS — I2510 Atherosclerotic heart disease of native coronary artery without angina pectoris: Secondary | ICD-10-CM | POA: Diagnosis not present

## 2015-07-13 DIAGNOSIS — R4182 Altered mental status, unspecified: Secondary | ICD-10-CM | POA: Insufficient documentation

## 2015-07-13 DIAGNOSIS — E119 Type 2 diabetes mellitus without complications: Secondary | ICD-10-CM | POA: Insufficient documentation

## 2015-07-13 DIAGNOSIS — Z951 Presence of aortocoronary bypass graft: Secondary | ICD-10-CM | POA: Insufficient documentation

## 2015-07-13 DIAGNOSIS — R0602 Shortness of breath: Secondary | ICD-10-CM

## 2015-07-13 LAB — COMPREHENSIVE METABOLIC PANEL
ALK PHOS: 55 U/L (ref 38–126)
ALT: 18 U/L (ref 17–63)
AST: 45 U/L — AB (ref 15–41)
Albumin: 3.4 g/dL — ABNORMAL LOW (ref 3.5–5.0)
Anion gap: 6 (ref 5–15)
BILIRUBIN TOTAL: 1 mg/dL (ref 0.3–1.2)
BUN: 16 mg/dL (ref 6–20)
CALCIUM: 8.4 mg/dL — AB (ref 8.9–10.3)
CO2: 30 mmol/L (ref 22–32)
Chloride: 107 mmol/L (ref 101–111)
Creatinine, Ser: 0.98 mg/dL (ref 0.61–1.24)
GFR calc Af Amer: >60 mL/min (ref >60)
Glucose, Bld: 102 mg/dL — ABNORMAL HIGH (ref 65–99)
POTASSIUM: 3.5 mmol/L (ref 3.5–5.1)
Sodium: 143 mmol/L (ref 135–145)
TOTAL PROTEIN: 6.1 g/dL — AB (ref 6.5–8.1)

## 2015-07-13 LAB — URINALYSIS COMPLETE WITH MICROSCOPIC (ARMC ONLY)
BILIRUBIN URINE: NEGATIVE
GLUCOSE, UA: NEGATIVE mg/dL
HGB URINE DIPSTICK: NEGATIVE
Ketones, ur: NEGATIVE mg/dL
Leukocytes, UA: NEGATIVE
Nitrite: NEGATIVE
PH: 5 (ref 5.0–8.0)
Protein, ur: NEGATIVE mg/dL
RBC / HPF: NONE SEEN RBC/hpf (ref 0–5)
Specific Gravity, Urine: 1.01 (ref 1.005–1.030)

## 2015-07-13 LAB — CBC WITH DIFFERENTIAL/PLATELET
BASOS ABS: 0 10*3/uL (ref 0–0.1)
EOS ABS: 0.1 10*3/uL (ref 0–0.7)
HCT: 27.7 % — ABNORMAL LOW (ref 40.0–52.0)
Hemoglobin: 8.8 g/dL — ABNORMAL LOW (ref 13.0–18.0)
Lymphs Abs: 0.5 10*3/uL — ABNORMAL LOW (ref 1.0–3.6)
MCH: 25.9 pg — ABNORMAL LOW (ref 26.0–34.0)
MCHC: 31.8 g/dL — AB (ref 32.0–36.0)
MCV: 81.4 fL (ref 80.0–100.0)
Monocytes Absolute: 0.2 10*3/uL (ref 0.2–1.0)
Monocytes Relative: 9 %
Neutro Abs: 1.5 10*3/uL (ref 1.4–6.5)
Neutrophils Relative %: 65 %
PLATELETS: 58 10*3/uL — AB (ref 150–440)
RBC: 3.4 MIL/uL — ABNORMAL LOW (ref 4.40–5.90)
RDW: 19.1 % — AB (ref 11.5–14.5)
WBC: 2.3 10*3/uL — AB (ref 3.8–10.6)

## 2015-07-13 LAB — AMMONIA: AMMONIA: 36 umol/L — AB (ref 9–35)

## 2015-07-13 LAB — GLUCOSE, CAPILLARY: GLUCOSE-CAPILLARY: 91 mg/dL (ref 65–99)

## 2015-07-13 LAB — TROPONIN I: Troponin I: 0.16 ng/mL — ABNORMAL HIGH (ref <0.031)

## 2015-07-13 LAB — LIPASE, BLOOD: LIPASE: 47 U/L (ref 11–51)

## 2015-07-13 MED ORDER — IPRATROPIUM-ALBUTEROL 0.5-2.5 (3) MG/3ML IN SOLN
3.0000 mL | Freq: Once | RESPIRATORY_TRACT | Status: AC
  Administered 2015-07-13: 3 mL via RESPIRATORY_TRACT
  Filled 2015-07-13: qty 3

## 2015-07-13 MED ORDER — FUROSEMIDE 10 MG/ML IJ SOLN
20.0000 mg | Freq: Once | INTRAMUSCULAR | Status: AC
  Administered 2015-07-13: 20 mg via INTRAVENOUS
  Filled 2015-07-13: qty 4

## 2015-07-13 NOTE — ED Notes (Signed)
Pt to CT

## 2015-07-13 NOTE — ED Notes (Signed)
Pt given urinal, unable to urinate at this time.

## 2015-07-13 NOTE — ED Provider Notes (Signed)
[facility] Emergency Department Provider Note  ____________________________________________  Time seen: Approximately 9:53 AM  I have reviewed the triage vital signs and the nursing notes.   HISTORY  Chief Complaint Diarrhea and Altered Mental Status    HPI Mark Ford is a 70 y.o. 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 Wife also reports that last night he was "talking out of his head" speaking to people who were not there. This is new for him. No recent falls. No headache. No numbness or weakness. No chest pain. No fevers. Denies dark or tarry stools or blood in stools recently though he was discharged on 06/25/2015 after treatment for upper GI bleed and troponin elevation which was thought to be secondary to demand ischemia. He has had diarrhea but denies any abdominal pain or vomiting.   Past Medical History  Diagnosis Date  . Cancer [facility])     adenocarcinoma fo the GE Junction  . GERD (gastroesophageal reflux disease)   . Diabetes mellitus without complication ([place])   . Thyroid disease     hypothroidism  . CAD (coronary artery disease)   . S/P CABG (coronary artery bypass graft)   . Hyperlipidemia   . 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 Esophageal varices [facility])     Patient Active Problem List   Diagnosis Date Noted  . GI bleed 06/22/2015  . Esophageal cancer ([place]) 11/11/2014    Past Surgical History  Procedure Laterality Date  . Coronary artery bypass graft    . Esophagogastroduodenoscopy (egd) with propofol N/A 02/11/2015    Procedure: ESOPHAGOGASTRODUODENOSCOPY (EGD) WITH PROPOFOL;  Surgeon: Manya Silvas, MD;  Location: [place];  Service: Endoscopy;  Laterality: N/A;  . Esophagogastroduodenoscopy (egd) with propofol N/A 06/23/2015    Procedure: ESOPHAGOGASTRODUODENOSCOPY (EGD) WITH PROPOFOL;  Surgeon: Josefine Class, MD;  Location: [facility] ENDOSCOPY;  Service: Endoscopy;  Laterality: N/A;    Current Outpatient Rx  Name  Route  Sig  Dispense  Refill  . allopurinol (ZYLOPRIM) 300 MG tablet   Oral   Take 300 mg by mouth daily. Take in the morning         . glipiZIDE (GLUCOTROL XL) 10 MG 24 hr tablet   Oral   Take 10 mg by mouth daily with breakfast.         . levothyroxine (SYNTHROID, LEVOTHROID) 100 MCG tablet   Oral   Take 100 mcg by mouth daily before breakfast.          . metFORMIN (GLUCOPHAGE) 1000 MG tablet   Oral   Take 1,000 mg by mouth 2 (two) times daily with a meal.         . nadolol (CORGARD) 20 MG tablet      1/2 tablet orally every evening   15 tablet   0   . pantoprazole (PROTONIX) 40 MG tablet   Oral   Take 1 tablet (40 mg total) by mouth 2 (two) times daily.   60 tablet   0   . simvastatin (ZOCOR) 40 MG tablet   Oral   Take 40 mg by mouth daily. Take at bedtime         . spironolactone (ALDACTONE) 50 MG tablet  Oral   Take 1 tablet by mouth daily.           Allergies Shellfish allergy and Tylenol  Family History  Problem Relation Age of Onset  . Hypertension      Social History Social History  Substance Use Topics  . Smoking status: Never Smoker   . Smokeless tobacco: Never Used  . Alcohol Use: No    Review of Systems Constitutional: No fever/chills Eyes: No visual changes. ENT: No sore throat. Cardiovascular: Denies chest pain. Respiratory: +shortness of breath. Gastrointestinal: No abdominal pain.  No nausea, no vomiting.  + diarrhea.  No constipation. Genitourinary: Negative for dysuria. Musculoskeletal: Negative for back pain. Skin: Negative for rash. Neurological: Negative for headaches, focal weakness or numbness.  10-point  ROS otherwise negative.  ____________________________________________   PHYSICAL EXAM:  VITAL SIGNS: ED Triage Vitals  Enc Vitals Group     BP 07/13/15 0938 121/54 mmHg     Pulse Rate 07/13/15 0938 67     Resp 07/13/15 0938 18     Temp 07/13/15 0938 98.9 F (37.2 C)     Temp Source 07/13/15 0938 Oral     SpO2 07/13/15 0938 98 %     Weight 07/13/15 0938 206 lb 6 oz (93.611 kg)     Height 07/13/15 0938 5\' 7"  (1.702 m)     Head Cir --      Peak Flow --      Pain Score 07/13/15 0939 0     Pain Loc --      Pain Edu? --      Excl. in [place]? --     Constitutional: Alert and oriented. Nontoxic appearing and in no acute distress. Eyes: Conjunctivae are normal. PERRL. EOMI. Head: Atraumatic. Nose: No congestion/rhinnorhea. Mouth/Throat: Mucous membranes are moist.  Oropharynx non-erythematous. Neck: No stridor.   Cardiovascular: Normal rate, regular rhythm. Grossly normal heart sounds.  Good peripheral circulation. Respiratory: Normal respiratory effort.  No retractions. Lungs CTAB. Gastrointestinal: Soft and nontender. No distention.  No CVA tenderness. Genitourinary: deferred Musculoskeletal: 1+ pitting edema bilateral lower extremity's. No joint effusions. Neurologic:  Normal speech and language. No gross focal neurologic deficits are appreciated. 5 out of 5 strength bilateral upper and lower extremities, sensation intact to light touch throughout. Cranial nerves II through XII intact. Normal finger-nose-finger. Skin:  Skin is warm, dry and intact. No rash noted. Psychiatric: Mood and affect are normal. Speech and behavior are normal.  ____________________________________________   LABS (all labs ordered are listed, but only abnormal results are displayed)  Labs Reviewed  COMPREHENSIVE METABOLIC PANEL - Abnormal; Notable for the following:    Glucose, Bld 102 (*)    Calcium 8.4 (*)    Total Protein 6.1 (*)    Albumin 3.4 (*)    AST 45 (*)    All other components within  normal limits  CBC WITH DIFFERENTIAL/PLATELET - Abnormal; Notable for the following:    WBC 2.3 (*)    RBC 3.40 (*)    Hemoglobin 8.8 (*)    HCT 27.7 (*)    MCH 25.9 (*)    MCHC 31.8 (*)    RDW 19.1 (*)    Platelets 58 (*)    Lymphs Abs 0.5 (*)    All other components within normal limits  TROPONIN I - Abnormal; Notable for the following:    Troponin I 0.16 (*)    All other components within normal limits  URINALYSIS COMPLETEWITH MICROSCOPIC ([facility] ONLY) - Abnormal; Notable for  the following:    Color, Urine YELLOW (*)    APPearance CLEAR (*)    Bacteria, UA RARE (*)    Squamous Epithelial / LPF 0-5 (*)    All other components within normal limits  AMMONIA - Abnormal; Notable for the following:    Ammonia 36 (*)    All other components within normal limits  LIPASE, BLOOD  GLUCOSE, CAPILLARY  CBG MONITORING, ED   ____________________________________________  EKG  ED ECG REPORT I, Joanne Gavel, the attending physician, personally viewed and interpreted this ECG.   Date: 07/13/2015  EKG Time: 09:53  Rate: 66  Rhythm: normal sinus rhythm  Axis: left  Intervals:nonspecific intraventricular conduction delay  ST&T Change: No acute ST elevation. Q-wave in lead 1, aVL.  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  No acute intracranial abnormalities.   CXR IMPRESSION: 1. Mild bilateral interstitial thickening and trace pleural effusions concerning for mild interstitial edema.  ____________________________________________   PROCEDURES  Procedure(s) performed: None  Critical Care performed: No  ____________________________________________   INITIAL IMPRESSION / ASSESSMENT AND PLAN / ED COURSE  Pertinent labs & imaging results that were available during my care of the patient were reviewed by me and considered in my medical decision making (see chart for details).  Mark Ford is a 70 y.o. 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 On exam he is nontoxic appearing, alert and oriented x4. He does have diffuse expiratory wheeze without increased work of breathing. Benign abdominal exam. Intact neurological examination. Plan for screening labs, CT head, chest x-ray, we'll check urinalysis and ammonia. Reassess for disposition.  ----------------------------------------- 12:19 PM on 07/13/2015 ----------------------------------------- Labs reviewed and are notable for chronic pancytopenia. Troponin elevated 0.16 but this is down trending from prior. Ammonia is mildly elevated at 36 but this is improved from prior as well. Chest x-ray shows interstitial edema. The patient is somewhat tachypneic. Given new onset CHF, the fact that he is on by mouth Lasix and spironolactone at home, I have given IV Lasix and discussed the case with Dr. Benjie Karvonen, hospitalist, for admission.  ----------------------------------------- 1:07 PM on 07/13/2015 ----------------------------------------- Case discussed with Dr. Benjie Karvonen for admission. She has evaluated the patient and declined admission, please see her dictated consult note for details. She recommends dc with outpatient f/u after IV lasix. I discussed to his return precautions with he and his wife and they're comfortable going home. He'll follow-up with Dr. Chancy Milroy tomorrow.   ____________________________________________   FINAL CLINICAL IMPRESSION(S) / ED DIAGNOSES  Final diagnoses:  Congestive heart failure, unspecified congestive heart failure chronicity, unspecified congestive heart failure type ([place])  Diarrhea, unspecified type  SOB (shortness of breath)      Joanne Gavel, MD 07/13/15 1729

## 2015-07-13 NOTE — Consult Note (Signed)
Medical Consultation  Mark Ford [id number] DOB: 06-Mar-1946 DOA: 07/13/2015 PCP: Maryland Pink, MD   Requesting physician:  Dr Edd Fabian Date of consultation: 07/13/2015  Reason for consultation: CHF  Impression/Recommendations  70 year old male with a history of esophageal adenocarcinoma and mild pulmonary hypertension and septal hypokinesis suggestive of coronary artery disease is mild/moderate mitral regurgitation on ECHo 06/2015 who initially presented to the emergency room with confusion. Hospital service was contacted for CHF.   1. Acute diastolic heart failure: Patient does have some mild wheezing on examination but is not hypoxic. Chest x-ray does show mild pulmonary edema. I would continue with 1 dose IV Lasix and then patient can continue his outpatient medications. Patient has a appointment tomorrow for CTA to evaluate for coronary artery disease. He should keep this appointment and will follow up with his cardiologist Dr. Humphrey Rolls  2. Encephalopathy, acute: Patient seems to be at baseline. Check NH3 level.  3. History of liver cirrhosis, cryptogenic and esophageal varices with recent consultation for GI bleed: Continue patient medications and follow-up with GI.   Patient may safely be discharged home pending ammonia level and follow up with PCP and cardiologist tomorrow.    Chief Complain: Confusion HPI:    This is a 70 year old male with a history of esophageal adenocarcinoma and cryptogenic liver cirrhosis with mild pulmonary hypertension and ejection fraction of 50% on echocardiogram January 2017 who presented with confusion. Hospital service was contacted for confusion. Patient does appear to be at baseline.  patient is not complaining of shortness of breath or chest pain. Wife is at bedside.   Review of Systems  Constitutional: Negative for fever, chills weight loss HENT: Negative for ear pain, nosebleeds, congestion, facial swelling, rhinorrhea, neck pain, neck  stiffness and ear discharge.   Respiratory: Negative for cough, has baselineshortness of breath and mildwheezing  Cardiovascular: Negative for chest pain, palpitations and leg swelling.  Gastrointestinal: Negative for heartburn, abdominal pain, vomiting, diarrhea or consitpation Genitourinary: Negative for dysuria, urgency, frequency, hematuria Musculoskeletal: Negative for back pain or joint pain Neurological: Negative for dizziness, seizures, syncope, focal weakness,  numbness and headaches.  Hematological: Does not bruise/bleed easily.  Psychiatric/Behavioral: Negative for hallucinations, ++confusion last night, NO dysphoric mood   Past Medical History  Diagnosis Date  . Cancer [facility])     adenocarcinoma fo the GE Junction  . GERD (gastroesophageal reflux disease)   . Diabetes mellitus without complication ([place])   . Thyroid disease     hypothroidism  . CAD (coronary artery disease)   . S/P CABG (coronary artery bypass graft)   . Hyperlipidemia   . 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 Esophageal varices ([facility])    Past Surgical History  Procedure Laterality Date  . Coronary artery bypass graft    . Esophagogastroduodenoscopy (egd) with propofol N/A 02/11/2015    Procedure: ESOPHAGOGASTRODUODENOSCOPY (EGD) WITH PROPOFOL;  Surgeon: Manya Silvas, MD;  Location: [place];  Service: Endoscopy;  Laterality: N/A;  . Esophagogastroduodenoscopy (egd) with propofol N/A 06/23/2015    Procedure: ESOPHAGOGASTRODUODENOSCOPY (EGD) WITH PROPOFOL;  Surgeon: Josefine Class, MD;  Location: [facility] ENDOSCOPY;  Service: Endoscopy;  Laterality: N/A;   Social History:  reports that he has never smoked. He has never used smokeless tobacco. He reports that he does not drink alcohol or use illicit drugs.  Allergies  Allergen Reactions  . Shellfish Allergy Other (See Comments)    GOUT  . Tylenol [Acetaminophen] Other (See Comments)    Not allergic, but was told not to take  it any more due to his  liver condition.    Family History  Problem Relation Age of Onset  . Hypertension      Prior to Admission medications   Medication Sig Start Date End Date Taking? Authorizing Provider  allopurinol (ZYLOPRIM) 300 MG tablet Take 300 mg by mouth daily. Take in the morning   Yes Historical Provider, MD  glipiZIDE (GLUCOTROL XL) 10 MG 24 hr tablet Take 10 mg by mouth daily with breakfast.   Yes Historical Provider, MD  levothyroxine (SYNTHROID, LEVOTHROID) 100 MCG tablet Take 100 mcg by mouth daily before breakfast.  08/24/14  Yes Historical Provider, MD  metFORMIN (GLUCOPHAGE) 1000 MG tablet Take 1,000 mg by mouth 2 (two) times daily with a meal.   Yes Historical Provider, MD  nadolol (CORGARD) 20 MG tablet 1/2 tablet orally every evening 06/25/15  Yes Loletha Grayer, MD  pantoprazole (PROTONIX) 40 MG tablet Take 1 tablet (40 mg total) by mouth 2 (two) times daily. 06/25/15  Yes Richard Leslye Peer, MD  simvastatin (ZOCOR) 40 MG tablet Take 40 mg by mouth daily. Take at bedtime   Yes Historical Provider, MD  spironolactone (ALDACTONE) 50 MG tablet Take 1 tablet by mouth daily. 07/06/15 07/05/16 Yes Historical Provider, MD    Physical Exam: Blood pressure 129/60, pulse 62, temperature 98.9 F (37.2 C), temperature source Oral, resp. rate 22, height 5\' 7"  (1.702 m), weight 93.611 kg (206 lb 6 oz), SpO2 94 %. @VITALS2 @ [employer]   07/13/15 0938  Weight: 93.611 kg (206 lb 6 oz)   No intake or output data in the 24 hours ending 07/13/15 1244   Constitutional: Appears well-developed and well-nourished. No distress. HENT: Normocephalic. [name] Oropharynx is clear and moist.  Eyes: Conjunctivae and EOM are normal. PERRLA, no scleral icterus.  Neck: Normal ROM. Neck supple. No JVD. No tracheal deviation. CVS: RRR, S1/S2 +, no murmurs, no gallops, no carotid bruit.  Pulmonary: Effort and breath sounds normal, no stridor, rhonchi, wheezes, rales.  Abdominal: Soft. BS +,  mild distension, NO tenderness,  rebound or guarding.  Musculoskeletal: Normal range of motion.  1+ bilateral LEedema and no tenderness.  Neuro: Alert. CN 2-12 grossly intact. No focal deficits. Skin: Skin is warm and dry. No rash noted. Psychiatric: Normal mood and affect.    Labs  Basic Metabolic Panel:  Recent Labs Lab 07/13/15 1004  NA 143  K 3.5  CL 107  CO2 30  GLUCOSE 102*  BUN 16  CREATININE 0.98  CALCIUM 8.4*   Liver Function Tests:  Recent Labs Lab 07/13/15 1004  AST 45*  ALT 18  ALKPHOS 55  BILITOT 1.0  PROT 6.1*  ALBUMIN 3.4*    Recent Labs Lab 07/13/15 1004  LIPASE 47    CBC:  Recent Labs Lab 07/13/15 1004  WBC 2.3*  NEUTROABS 1.5  HGB 8.8*  HCT 27.7*  MCV 81.4  PLT 58*   Cardiac Enzymes:  Recent Labs Lab 07/13/15 1004  TROPONINI 0.16*   BNP: Invalid input(s): POCBNP CBG:  Recent Labs Lab 07/13/15 1014  GLUCAP 91    Radiological Exams: Dg Chest 2 View  07/13/2015  CLINICAL DATA:  Shortness of breath. EXAM: CHEST  2 VIEW COMPARISON:  CT chest 06/30/2015 FINDINGS: There is right apical pleural thickening. There is bilateral interstitial thickening. There are trace bilateral pleural effusions. There is no pneumothorax. The heart and mediastinal contours are unremarkable. There is evidence of prior CABG. There is a right-sided Port-A-Cath in satisfactory position. The osseous  structures are unremarkable. IMPRESSION: 1. Mild bilateral interstitial thickening and trace pleural effusions concerning for mild interstitial edema. Electronically Signed   By: Kathreen Devoid   On: 07/13/2015 11:08   Ct Head Wo Contrast  07/13/2015  CLINICAL DATA:  Hallucinations last night, recently changed 2 different medications, history esophageal cancer post chemotherapy and radiation therapy, coronary artery disease post CABG, cryptogenic cirrhosis with esophageal varices, diabetes mellitus EXAM: CT HEAD WITHOUT CONTRAST TECHNIQUE: Contiguous axial images were obtained from the base of  the skull through the vertex without intravenous contrast. COMPARISON:  None FINDINGS: Minimal atrophy. Normal ventricular morphology. No midline shift or mass effect. Small old lacunar infarct at LEFT basal ganglia. No intracranial hemorrhage, mass lesion, or evidence acute infarction. No extra-axial fluid collections. Atherosclerotic calcifications at the carotid siphons. Bones and sinuses unremarkable. 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 No acute intracranial abnormalities. Electronically Signed   By: Lavonia Dana M.D.   On: 07/13/2015 10:53    EKG:  no ST elevation or depression   Thank you for allowing me to participate in the care of your patient. plan of care discussed with ER physician, Dr. Edd Fabian. Plan of care discussed with family at bedside. They are in agreement for discharge.   Note: This dictation was prepared 4th Dragon dictation along with smaller phrase technology. Any transcriptional errors that result from this process are unintentional.  Time spent: 45  Hurshel Bouillon, MD

## 2015-07-13 NOTE — ED Notes (Signed)
On new meds.  For fluid.  Now pt was up all night talking to people who were not there.  Also cough.  And trouble breathing

## 2015-07-23 ENCOUNTER — Encounter: Payer: Self-pay | Admitting: *Deleted

## 2015-07-23 ENCOUNTER — Ambulatory Visit
Admission: RE | Admit: 2015-07-23 | Discharge: 2015-07-23 | Disposition: A | Discharge status: Home / Self Care | Payer: Medicare Other | Source: Ambulatory Visit | Attending: Oncology | Admitting: Oncology

## 2015-07-23 DIAGNOSIS — K2210 Ulcer of esophagus without bleeding: Secondary | ICD-10-CM | POA: Diagnosis not present

## 2015-07-23 DIAGNOSIS — Z7984 Long term (current) use of oral hypoglycemic drugs: Secondary | ICD-10-CM | POA: Diagnosis not present

## 2015-07-23 DIAGNOSIS — C155 Malignant neoplasm of lower third of esophagus: Secondary | ICD-10-CM | POA: Diagnosis not present

## 2015-07-23 DIAGNOSIS — K766 Portal hypertension: Secondary | ICD-10-CM | POA: Diagnosis not present

## 2015-07-23 DIAGNOSIS — Z886 Allergy status to analgesic agent status: Secondary | ICD-10-CM | POA: Diagnosis not present

## 2015-07-23 DIAGNOSIS — Z8249 Family history of ischemic heart disease and other diseases of the circulatory system: Secondary | ICD-10-CM | POA: Diagnosis not present

## 2015-07-23 DIAGNOSIS — E785 Hyperlipidemia, unspecified: Secondary | ICD-10-CM | POA: Diagnosis not present

## 2015-07-23 DIAGNOSIS — Z79899 Other long term (current) drug therapy: Secondary | ICD-10-CM | POA: Diagnosis not present

## 2015-07-23 DIAGNOSIS — I8510 Secondary esophageal varices without bleeding: Secondary | ICD-10-CM | POA: Diagnosis not present

## 2015-07-23 DIAGNOSIS — I2510 Atherosclerotic heart disease of native coronary artery without angina pectoris: Secondary | ICD-10-CM | POA: Diagnosis not present

## 2015-07-23 DIAGNOSIS — K7460 Unspecified cirrhosis of liver: Secondary | ICD-10-CM | POA: Diagnosis not present

## 2015-07-23 DIAGNOSIS — E119 Type 2 diabetes mellitus without complications: Secondary | ICD-10-CM | POA: Diagnosis not present

## 2015-07-23 DIAGNOSIS — K219 Gastro-esophageal reflux disease without esophagitis: Secondary | ICD-10-CM | POA: Diagnosis not present

## 2015-07-23 DIAGNOSIS — Z951 Presence of aortocoronary bypass graft: Secondary | ICD-10-CM | POA: Diagnosis not present

## 2015-07-23 DIAGNOSIS — R188 Other ascites: Secondary | ICD-10-CM | POA: Diagnosis not present

## 2015-07-23 DIAGNOSIS — I8500 Esophageal varices without bleeding: Secondary | ICD-10-CM | POA: Diagnosis present

## 2015-07-23 DIAGNOSIS — Z91013 Allergy to seafood: Secondary | ICD-10-CM | POA: Diagnosis not present

## 2015-07-23 DIAGNOSIS — Z801 Family history of malignant neoplasm of trachea, bronchus and lung: Secondary | ICD-10-CM | POA: Diagnosis not present

## 2015-07-23 DIAGNOSIS — R0602 Shortness of breath: Secondary | ICD-10-CM | POA: Diagnosis not present

## 2015-07-23 DIAGNOSIS — E079 Disorder of thyroid, unspecified: Secondary | ICD-10-CM | POA: Diagnosis not present

## 2015-07-23 LAB — GLUCOSE, CAPILLARY: GLUCOSE-CAPILLARY: 85 mg/dL (ref 65–99)

## 2015-07-23 MED ORDER — FLUDEOXYGLUCOSE F - 18 (FDG) INJECTION
11.6900 | Freq: Once | INTRAVENOUS | Status: AC | PRN
  Administered 2015-07-23: 11.69 via INTRAVENOUS

## 2015-07-25 DIAGNOSIS — I8510 Secondary esophageal varices without bleeding: Secondary | ICD-10-CM | POA: Diagnosis not present

## 2015-07-26 ENCOUNTER — Ambulatory Visit: Payer: Medicare Other | Admitting: Anesthesiology

## 2015-07-26 ENCOUNTER — Ambulatory Visit
Admission: RE | Admit: 2015-07-26 | Discharge: 2015-07-26 | Disposition: A | Discharge status: Home / Self Care | Payer: Medicare Other | Source: Ambulatory Visit | Attending: Gastroenterology | Admitting: Gastroenterology

## 2015-07-26 ENCOUNTER — Encounter
Admission: RE | Disposition: A | Discharge status: Home / Self Care | Payer: Self-pay | Source: Ambulatory Visit | Attending: Gastroenterology

## 2015-07-26 ENCOUNTER — Encounter: Payer: Self-pay | Admitting: Anesthesiology

## 2015-07-26 ENCOUNTER — Other Ambulatory Visit: Payer: Medicare Other

## 2015-07-26 ENCOUNTER — Ambulatory Visit: Payer: Medicare Other | Admitting: Oncology

## 2015-07-26 DIAGNOSIS — I8510 Secondary esophageal varices without bleeding: Secondary | ICD-10-CM | POA: Diagnosis not present

## 2015-07-26 DIAGNOSIS — Z801 Family history of malignant neoplasm of trachea, bronchus and lung: Secondary | ICD-10-CM | POA: Insufficient documentation

## 2015-07-26 DIAGNOSIS — Z91013 Allergy to seafood: Secondary | ICD-10-CM | POA: Insufficient documentation

## 2015-07-26 DIAGNOSIS — Z7984 Long term (current) use of oral hypoglycemic drugs: Secondary | ICD-10-CM | POA: Insufficient documentation

## 2015-07-26 DIAGNOSIS — Z886 Allergy status to analgesic agent status: Secondary | ICD-10-CM | POA: Insufficient documentation

## 2015-07-26 DIAGNOSIS — E079 Disorder of thyroid, unspecified: Secondary | ICD-10-CM | POA: Insufficient documentation

## 2015-07-26 DIAGNOSIS — C155 Malignant neoplasm of lower third of esophagus: Secondary | ICD-10-CM | POA: Insufficient documentation

## 2015-07-26 DIAGNOSIS — E785 Hyperlipidemia, unspecified: Secondary | ICD-10-CM | POA: Insufficient documentation

## 2015-07-26 DIAGNOSIS — E119 Type 2 diabetes mellitus without complications: Secondary | ICD-10-CM | POA: Insufficient documentation

## 2015-07-26 DIAGNOSIS — Z8249 Family history of ischemic heart disease and other diseases of the circulatory system: Secondary | ICD-10-CM | POA: Insufficient documentation

## 2015-07-26 DIAGNOSIS — K766 Portal hypertension: Secondary | ICD-10-CM | POA: Insufficient documentation

## 2015-07-26 DIAGNOSIS — Z79899 Other long term (current) drug therapy: Secondary | ICD-10-CM | POA: Insufficient documentation

## 2015-07-26 DIAGNOSIS — K219 Gastro-esophageal reflux disease without esophagitis: Secondary | ICD-10-CM | POA: Insufficient documentation

## 2015-07-26 DIAGNOSIS — R0602 Shortness of breath: Secondary | ICD-10-CM | POA: Insufficient documentation

## 2015-07-26 DIAGNOSIS — K2210 Ulcer of esophagus without bleeding: Secondary | ICD-10-CM | POA: Insufficient documentation

## 2015-07-26 DIAGNOSIS — Z951 Presence of aortocoronary bypass graft: Secondary | ICD-10-CM | POA: Insufficient documentation

## 2015-07-26 DIAGNOSIS — R188 Other ascites: Secondary | ICD-10-CM | POA: Insufficient documentation

## 2015-07-26 DIAGNOSIS — I2510 Atherosclerotic heart disease of native coronary artery without angina pectoris: Secondary | ICD-10-CM | POA: Insufficient documentation

## 2015-07-26 DIAGNOSIS — K7460 Unspecified cirrhosis of liver: Secondary | ICD-10-CM | POA: Insufficient documentation

## 2015-07-26 HISTORY — PX: ESOPHAGOGASTRODUODENOSCOPY (EGD) WITH PROPOFOL: SHX5813

## 2015-07-26 LAB — GLUCOSE, CAPILLARY: GLUCOSE-CAPILLARY: 118 mg/dL — AB (ref 65–99)

## 2015-07-26 SURGERY — ESOPHAGOGASTRODUODENOSCOPY (EGD) WITH PROPOFOL
Anesthesia: General

## 2015-07-26 MED ORDER — GLYCOPYRROLATE 0.2 MG/ML IJ SOLN
INTRAMUSCULAR | Status: DC | PRN
  Administered 2015-07-26: 0.2 mg via INTRAVENOUS

## 2015-07-26 MED ORDER — PHENYLEPHRINE HCL 10 MG/ML IJ SOLN
INTRAMUSCULAR | Status: DC | PRN
  Administered 2015-07-26: 200 ug via INTRAVENOUS
  Administered 2015-07-26 (×2): 100 ug via INTRAVENOUS

## 2015-07-26 MED ORDER — PROPOFOL 500 MG/50ML IV EMUL
INTRAVENOUS | Status: DC | PRN
  Administered 2015-07-26: 150 ug/kg/min via INTRAVENOUS

## 2015-07-26 MED ORDER — LIDOCAINE HCL (CARDIAC) 20 MG/ML IV SOLN
INTRAVENOUS | Status: DC | PRN
  Administered 2015-07-26: 100 mg via INTRAVENOUS

## 2015-07-26 MED ORDER — SODIUM CHLORIDE 0.9 % IV SOLN
INTRAVENOUS | Status: DC
  Administered 2015-07-26: 1000 mL via INTRAVENOUS

## 2015-07-26 MED ORDER — PROPOFOL 10 MG/ML IV BOLUS
INTRAVENOUS | Status: DC | PRN
  Administered 2015-07-26: 30 mg via INTRAVENOUS
  Administered 2015-07-26: 80 mg via INTRAVENOUS

## 2015-07-26 NOTE — OR Nursing (Signed)
10:15 - Patient missed Nadolol dose last night.  Dr. Harriet Pho notified.  No new orders received.

## 2015-07-26 NOTE — Op Note (Signed)
[facility] Gastroenterology Patient Name: Mark Ford Procedure Date: 07/26/2015 10:41 AM MRN: [id number] Account #: [id number] Date of Birth: [birth year] Admit Type: Outpatient Age: 70 Room: [facility] ENDO ROOM 2 Gender: Male Note Status: Finalized Procedure:         Upper GI endoscopy Indications:       For therapy of esophageal varices, Personal history of                     malignant esophageal neoplasm Patient Profile:   This is a 70 year old male. Providers:         Gerrit Heck. Rayann Heman, MD Referring MD:      Irven Easterly. Kary Kos, MD (Referring MD) Medicines:         Propofol per Anesthesia Complications:     No immediate complications. Procedure:         Pre-Anesthesia Assessment:                    - Prior to the procedure, a History and Physical was                     performed, and patient medications, allergies and                     sensitivities were reviewed. The patient's tolerance of                     previous anesthesia was reviewed.                    After obtaining informed consent, the endoscope was passed                     under direct vision. Throughout the procedure, the                     patient's blood pressure, pulse, and oxygen saturations                     were monitored continuously. The Endoscope was introduced                     through the mouth, and advanced to the second part of                     duodenum. The upper GI endoscopy was accomplished without                     difficulty. The patient tolerated the procedure well. Findings:      Grade I varices were found in the lower third of the esophagus.      One superficial esophageal ulcer from prior treatment and no stigmata of       recent bleeding was found in the lower third of the esophagus. The       lesion was 5 mm in largest dimension.      The stomach was normal.      The examined duodenum was normal. Impression:        - Grade I esophageal varices.      - Esophageal ulcer.                    - Normal stomach.                    -  Normal examined duodenum.                    - No specimens collected. Recommendation:    - Observe patient in GI recovery unit.                    - Resume regular diet.                    - Continue present medications.                    - Continue nadolol 20 mg daily. Titrate to HR of 60.                    - Return to liver clinic.                    - The findings and recommendations were discussed with the                     patient.                    - The findings and recommendations were discussed with the                     patient's family. Procedure Code(s): --- Professional ---                    [phone], Esophagogastroduodenoscopy, flexible, transoral;                     diagnostic, including collection of specimen(s) by                     brushing or washing, when performed (separate procedure) Diagnosis Code(s): --- Professional ---                    I85.00, Esophageal varices without bleeding                    K22.10, Ulcer of esophagus without bleeding                    Z85.01, Personal history of malignant neoplasm of esophagus CPT copyright 2016 American Medical Association. All rights reserved. The codes documented in this report are preliminary and upon coder review may  be revised to meet current compliance requirements. Mellody Life, MD 07/26/2015 11:01:20 AM This report has been signed electronically. Number of Addenda: 0 Note Initiated On: 07/26/2015 10:41 AM      [facility]

## 2015-07-26 NOTE — Transfer of Care (Signed)
Immediate Anesthesia Transfer of Care Note  Patient: Mark Ford  Procedure(s) Performed: Procedure(s): ESOPHAGOGASTRODUODENOSCOPY (EGD) WITH PROPOFOL (N/A)  Patient Location: Endoscopy Unit  Anesthesia Type:General  Level of Consciousness: sedated  Airway & Oxygen Therapy: Patient Spontanous Breathing and Patient connected to nasal cannula oxygen  Post-op Assessment: Report given to RN and Post -op Vital signs reviewed and stable  Post vital signs: Reviewed and stable  Last Vitals:  Filed Vitals:   07/26/15 1006  BP: 116/48  Pulse: 79  Temp: 36.8 C  Resp: 16    Complications: No apparent anesthesia complications

## 2015-07-26 NOTE — H&P (Signed)
Primary Care Physician:  Maryland Pink, MD  Pre-Procedure History & Physical: HPI:  JACOBI RATLIFF is a 70 y.o. male is here for an endoscopy.   Past Medical History  Diagnosis Date  . Cancer [facility])     adenocarcinoma fo the GE Junction  . GERD (gastroesophageal reflux disease)   . Diabetes mellitus without complication ([place])   . Thyroid disease     hypothroidism  . CAD (coronary artery disease)   . S/P CABG (coronary artery bypass graft)   . Hyperlipidemia   . 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 Esophageal varices (HCC)     Past Surgical History  Procedure Laterality Date  . Coronary artery bypass graft    . Esophagogastroduodenoscopy (egd) with propofol N/A 02/11/2015    Procedure: ESOPHAGOGASTRODUODENOSCOPY (EGD) WITH PROPOFOL;  Surgeon: Manya Silvas, MD;  Location: [place];  Service: Endoscopy;  Laterality: N/A;  . Esophagogastroduodenoscopy (egd) with propofol N/A 06/23/2015    Procedure: ESOPHAGOGASTRODUODENOSCOPY (EGD) WITH PROPOFOL;  Surgeon: Josefine Class, MD;  Location: [facility] ENDOSCOPY;  Service: Endoscopy;  Laterality: N/A;    Prior to Admission medications   Medication Sig Start Date End Date Taking? Authorizing Provider  allopurinol (ZYLOPRIM) 300 MG tablet Take 300 mg by mouth daily. Take in the morning   Yes Historical Provider, MD  glipiZIDE (GLUCOTROL XL) 10 MG 24 hr tablet Take 10 mg by mouth daily with breakfast.   Yes Historical Provider, MD  levothyroxine (SYNTHROID, LEVOTHROID) 100 MCG tablet Take 100 mcg by mouth daily before breakfast.  08/24/14  Yes Historical Provider, MD  metFORMIN (GLUCOPHAGE) 1000 MG tablet Take 1,000 mg by mouth 2 (two) times daily with a meal.   Yes Historical Provider, MD  nadolol (CORGARD) 20 MG tablet 1/2 tablet orally every evening 06/25/15  Yes Loletha Grayer, MD  pantoprazole (PROTONIX) 40 MG tablet Take 1 tablet (40 mg total) by mouth 2 (two) times daily. 06/25/15  Yes Richard Leslye Peer, MD  simvastatin (ZOCOR) 40  MG tablet Take 40 mg by mouth daily. Take at bedtime   Yes Historical Provider, MD  spironolactone (ALDACTONE) 50 MG tablet Take 1 tablet by mouth daily. 07/06/15 07/05/16 Yes Historical Provider, MD    Allergies as of 07/20/2015 - Review Complete 07/13/2015  Allergen Reaction Noted  . Shellfish allergy Other (See Comments) 10/20/2014  . Tylenol [acetaminophen] Other (See Comments) 10/20/2014    Family History  Problem Relation Age of Onset  . Hypertension      Social History   Social History  . Marital Status: Married    Spouse Name: N/A  . Number of Children: N/A  . Years of Education: N/A   Occupational History  . Not on file.   Social History Main Topics  . Smoking status: Never Smoker   . Smokeless tobacco: Never Used  . Alcohol Use: No  . Drug Use: No  . Sexual Activity: Not on file   Other Topics Concern  . Not on file   Social History Narrative     Physical Exam: BP 116/48 mmHg  Pulse 79  Temp(Src) 98.3 F (36.8 C) (Tympanic)  Resp 16  Ht 5\' 7"  (1.702 m)  Wt 86.183 kg (190 lb)  BMI 29.75 kg/m2  SpO2 100% General:   Alert,  pleasant and cooperative in NAD Head:  Normocephalic and atraumatic. Neck:  Supple; no masses or thyromegaly. Lungs:  Clear throughout to auscultation.    Heart:  Regular rate and rhythm. Abdomen:  Soft, nontender and nondistended. Normal  bowel sounds, without guarding, and without rebound.   Neurologic:  Alert and  oriented x4;  grossly normal neurologically.  Impression/Plan: JOEZIAH SALEM is here for an endoscopy to be performed for f/u esophageal varices, possible banding  Risks, benefits, limitations, and alternatives regarding  endoscopy have been reviewed with the patient.  Questions have been answered.  All parties agreeable.   Josefine Class, MD  07/26/2015, 10:39 AM

## 2015-07-26 NOTE — Anesthesia Preprocedure Evaluation (Signed)
Anesthesia Evaluation  Patient identified by MRN, date of birth, ID band Patient awake    Reviewed: Allergy & Precautions, H&P , NPO status , Patient's Chart, lab work & pertinent test results  History of Anesthesia Complications Negative for: history of anesthetic complications  Airway Mallampati: III  TM Distance: >3 FB Neck ROM: full    Dental  (+) Poor Dentition, Chipped   Pulmonary shortness of breath,    Pulmonary exam normal breath sounds clear to auscultation       Cardiovascular Exercise Tolerance: Poor (-) angina+ CAD, +CHF and + DOE  Normal cardiovascular exam Rhythm:regular Rate:Normal     Neuro/Psych negative neurological ROS  negative psych ROS   GI/Hepatic Neg liver ROS, GERD  ,  Endo/Other  diabetes, Type 2  Renal/GU negative Renal ROS  negative genitourinary   Musculoskeletal   Abdominal   Peds  Hematology negative hematology ROS (+)   Anesthesia Other Findings Past Medical History:   Cancer ([place])                                                   Comment:adenocarcinoma fo the GE Junction   GERD (gastroesophageal reflux disease)                       Diabetes mellitus without complication (HCC)                 Thyroid disease                                                Comment:hypothroidism   CAD (coronary artery disease)                                S/P CABG (coronary artery bypass graft)                      Hyperlipidemia                                               Cirrhosis, cryptogenic (HCC)                                 Esophageal varices (HCC)                                    Past Surgical History:   CORONARY ARTERY BYPASS GRAFT                                  ESOPHAGOGASTRODUODENOSCOPY (EGD) WITH PROPOFOL  N/A 02/11/2015       Comment:Procedure: ESOPHAGOGASTRODUODENOSCOPY (EGD)               WITH PROPOFOL;  Surgeon: Manya Silvas, MD;  Location: [facility]  ENDOSCOPY;  Service: Endoscopy;               Laterality: N/A;   ESOPHAGOGASTRODUODENOSCOPY (EGD) WITH PROPOFOL  N/A 06/23/2015      Comment:Procedure: ESOPHAGOGASTRODUODENOSCOPY (EGD)               WITH PROPOFOL;  Surgeon: Josefine Class,               MD;  Location: [facility] ENDOSCOPY;  Service:               Endoscopy;  Laterality: N/A;  BMI    Body Mass Index   29.75 kg/m 2      Reproductive/Obstetrics negative OB ROS                             Anesthesia Physical Anesthesia Plan  ASA: III  Anesthesia Plan: General   Post-op Pain Management:    Induction:   Airway Management Planned:   Additional Equipment:   Intra-op Plan:   Post-operative Plan:   Informed Consent: I have reviewed the patients History and Physical, chart, labs and discussed the procedure including the risks, benefits and alternatives for the proposed anesthesia with the patient or authorized representative who has indicated his/her understanding and acceptance.   Dental Advisory Given  Plan Discussed with: Anesthesiologist, CRNA and Surgeon  Anesthesia Plan Comments:         Anesthesia Quick Evaluation

## 2015-07-26 NOTE — Discharge Instructions (Signed)

## 2015-07-26 NOTE — Anesthesia Postprocedure Evaluation (Signed)
Anesthesia Post Note  Patient: Mark Ford  Procedure(s) Performed: Procedure(s) (LRB): ESOPHAGOGASTRODUODENOSCOPY (EGD) WITH PROPOFOL (N/A)  Patient location during evaluation: Endoscopy Anesthesia Type: General Level of consciousness: awake and alert Pain management: pain level controlled Vital Signs Assessment: post-procedure vital signs reviewed and stable Respiratory status: spontaneous breathing, nonlabored ventilation, respiratory function stable and patient connected to nasal cannula oxygen Cardiovascular status: blood pressure returned to baseline and stable Postop Assessment: no signs of nausea or vomiting Anesthetic complications: no    Last Vitals:  Filed Vitals:   07/26/15 1137 07/26/15 1147  BP: 131/56 128/53  Pulse: 80 80  Temp:    Resp: 17 22    Last Pain:  Filed Vitals:   07/26/15 1149  PainSc: 0-No pain                 Precious Haws Asmar Brozek

## 2015-07-28 ENCOUNTER — Inpatient Hospital Stay: Payer: Medicare Other | Attending: Oncology

## 2015-07-28 ENCOUNTER — Inpatient Hospital Stay: Payer: Medicare Other

## 2015-07-28 ENCOUNTER — Encounter: Payer: Self-pay | Admitting: Gastroenterology

## 2015-07-28 ENCOUNTER — Inpatient Hospital Stay (HOSPITAL_BASED_OUTPATIENT_CLINIC_OR_DEPARTMENT_OTHER): Payer: Medicare Other | Admitting: Oncology

## 2015-07-28 VITALS — BP 116/64 | HR 72 | Temp 98.4°F | Wt 185.2 lb

## 2015-07-28 DIAGNOSIS — I509 Heart failure, unspecified: Secondary | ICD-10-CM | POA: Insufficient documentation

## 2015-07-28 DIAGNOSIS — D72819 Decreased white blood cell count, unspecified: Secondary | ICD-10-CM

## 2015-07-28 DIAGNOSIS — B019 Varicella without complication: Secondary | ICD-10-CM | POA: Insufficient documentation

## 2015-07-28 DIAGNOSIS — C155 Malignant neoplasm of lower third of esophagus: Secondary | ICD-10-CM

## 2015-07-28 DIAGNOSIS — Z951 Presence of aortocoronary bypass graft: Secondary | ICD-10-CM | POA: Insufficient documentation

## 2015-07-28 DIAGNOSIS — C160 Malignant neoplasm of cardia: Secondary | ICD-10-CM

## 2015-07-28 DIAGNOSIS — R531 Weakness: Secondary | ICD-10-CM | POA: Diagnosis not present

## 2015-07-28 DIAGNOSIS — R5381 Other malaise: Secondary | ICD-10-CM

## 2015-07-28 DIAGNOSIS — E785 Hyperlipidemia, unspecified: Secondary | ICD-10-CM | POA: Diagnosis not present

## 2015-07-28 DIAGNOSIS — Z923 Personal history of irradiation: Secondary | ICD-10-CM | POA: Insufficient documentation

## 2015-07-28 DIAGNOSIS — Z7984 Long term (current) use of oral hypoglycemic drugs: Secondary | ICD-10-CM

## 2015-07-28 DIAGNOSIS — E039 Hypothyroidism, unspecified: Secondary | ICD-10-CM | POA: Diagnosis not present

## 2015-07-28 DIAGNOSIS — D696 Thrombocytopenia, unspecified: Secondary | ICD-10-CM | POA: Diagnosis not present

## 2015-07-28 DIAGNOSIS — R5383 Other fatigue: Secondary | ICD-10-CM | POA: Diagnosis not present

## 2015-07-28 DIAGNOSIS — I8500 Esophageal varices without bleeding: Secondary | ICD-10-CM | POA: Insufficient documentation

## 2015-07-28 DIAGNOSIS — M1990 Unspecified osteoarthritis, unspecified site: Secondary | ICD-10-CM | POA: Insufficient documentation

## 2015-07-28 DIAGNOSIS — I2510 Atherosclerotic heart disease of native coronary artery without angina pectoris: Secondary | ICD-10-CM | POA: Insufficient documentation

## 2015-07-28 DIAGNOSIS — Z9221 Personal history of antineoplastic chemotherapy: Secondary | ICD-10-CM

## 2015-07-28 DIAGNOSIS — E119 Type 2 diabetes mellitus without complications: Secondary | ICD-10-CM

## 2015-07-28 DIAGNOSIS — C159 Malignant neoplasm of esophagus, unspecified: Secondary | ICD-10-CM

## 2015-07-28 DIAGNOSIS — K7460 Unspecified cirrhosis of liver: Secondary | ICD-10-CM | POA: Diagnosis not present

## 2015-07-28 DIAGNOSIS — D649 Anemia, unspecified: Secondary | ICD-10-CM | POA: Insufficient documentation

## 2015-07-28 DIAGNOSIS — M109 Gout, unspecified: Secondary | ICD-10-CM | POA: Insufficient documentation

## 2015-07-28 DIAGNOSIS — Z79899 Other long term (current) drug therapy: Secondary | ICD-10-CM | POA: Insufficient documentation

## 2015-07-28 LAB — CBC WITH DIFFERENTIAL/PLATELET
BASOS PCT: 1 %
Basophils Absolute: 0 10*3/uL (ref 0–0.1)
EOS ABS: 0 10*3/uL (ref 0–0.7)
Eosinophils Relative: 1 %
HCT: 33.9 % — ABNORMAL LOW (ref 40.0–52.0)
HEMOGLOBIN: 11 g/dL — AB (ref 13.0–18.0)
Lymphocytes Relative: 20 %
Lymphs Abs: 0.7 10*3/uL — ABNORMAL LOW (ref 1.0–3.6)
MCH: 25.9 pg — ABNORMAL LOW (ref 26.0–34.0)
MCHC: 32.4 g/dL (ref 32.0–36.0)
MCV: 79.7 fL — ABNORMAL LOW (ref 80.0–100.0)
MONOS PCT: 10 %
Monocytes Absolute: 0.3 10*3/uL (ref 0.2–1.0)
NEUTROS PCT: 68 %
Neutro Abs: 2.3 10*3/uL (ref 1.4–6.5)
Platelets: 70 10*3/uL — ABNORMAL LOW (ref 150–440)
RBC: 4.25 MIL/uL — AB (ref 4.40–5.90)
RDW: 19.5 % — ABNORMAL HIGH (ref 11.5–14.5)
WBC: 3.4 10*3/uL — AB (ref 3.8–10.6)

## 2015-07-28 NOTE — Progress Notes (Signed)
Mark Ford  [phone]) [phone] Fax:(336) [phone]  ID: Janann Colonel OB: 1945-08-18  MR#: [id number]  NTZ#:[id number]  Patient Care Team: Maryland Pink, MD as PCP - General (Family Medicine) Manya Silvas, MD (Gastroenterology)  CHIEF COMPLAINT:  Chief Complaint  Patient presents with  . Esophageal Cancer  . imaging results    INTERVAL HISTORY: Patient returns to clinic today for further evaluation and discussion of his Pet scan results. Other than fatigue, he currently feels well and asymptomatic. He has no neurologic complaints. He denies any fevers. He denies any weight loss. He has no chest pain or shortness of breath. He denies any constipation or diarrhea. He has no further melena or hematochezia. He denies any pain. He has no urinary complaints. Patient offers no further specific complaints today.   REVIEW OF SYSTEMS:   Review of Systems  Constitutional: Positive for malaise/fatigue. Negative for fever.  HENT: Negative.   Respiratory: Negative.   Cardiovascular: Negative.   Gastrointestinal: Negative.  Negative for blood in stool and melena.  Musculoskeletal: Negative.   Neurological: Positive for weakness.  Endo/Heme/Allergies: Does not bruise/bleed easily.    As per HPI. Otherwise, a complete review of systems is negatve.  PAST MEDICAL HISTORY: Past Medical History  Diagnosis Date  . Cancer [facility])     adenocarcinoma fo the GE Junction  . GERD (gastroesophageal reflux disease)   . Diabetes mellitus without complication ([place])   . Thyroid disease     hypothroidism  . CAD (coronary artery disease)   . S/P CABG (coronary artery bypass graft)   . Hyperlipidemia   . 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 Esophageal varices ([place])   . Heart failure ([place])     PAST SURGICAL HISTORY: Past Surgical History  Procedure Laterality Date  . Coronary artery bypass graft    . Esophagogastroduodenoscopy (egd) with propofol N/A 02/11/2015    Procedure:  ESOPHAGOGASTRODUODENOSCOPY (EGD) WITH PROPOFOL;  Surgeon: Manya Silvas, MD;  Location: [place];  Service: Endoscopy;  Laterality: N/A;  . Esophagogastroduodenoscopy (egd) with propofol N/A 06/23/2015    Procedure: ESOPHAGOGASTRODUODENOSCOPY (EGD) WITH PROPOFOL;  Surgeon: Josefine Class, MD;  Location[facility] END[facility]COPY;  Service: Endoscopy;  Laterality: N/A;  . Esophagogastroduodenoscopy (egd) with propofol N/A 07/26/2015    Procedure: ESOPHAGOGASTRODUODENOSCOPY (EGD) WITH PROPOFOL;  Surgeon: Josefine Class, MD;  Location: [facility] ENDOSCOPY;  Service: Endoscopy;  Laterality: N/A;    FAMILY HISTORY Family History  Problem Relation Age of Onset  . Hypertension         ADVANCED DIRECTIVES:    HEALTH MAINTENANCE: Social History  Substance Use Topics  . Smoking status: Never Smoker   . Smokeless tobacco: Never Used  . Alcohol Use: No     Colonoscopy:  PAP:  Bone density:  Lipid panel:  Allergies  Allergen Reactions  . Shellfish Allergy Other (See Comments)    GOUT  . Tylenol [Acetaminophen] Other (See Comments)    Not allergic, but was told not to take it any more due to his liver condition.     Current Outpatient Prescriptions  Medication Sig Dispense Refill  . allopurinol (ZYLOPRIM) 300 MG tablet Take 300 mg by mouth daily. Take in the morning    . glipiZIDE (GLUCOTROL XL) 10 MG 24 hr tablet Take 10 mg by mouth daily with breakfast.    . levothyroxine (SYNTHROID, LEVOTHROID) 100 MCG tablet Take 100 mcg by mouth daily before breakfast.     . metFORMIN (GLUCOPHAGE) 1000 MG tablet Take 1,000  mg by mouth 2 (two) times daily with a meal.    . nadolol (CORGARD) 20 MG tablet 1/2 tablet orally every evening 15 tablet 0  . pantoprazole (PROTONIX) 40 MG tablet Take 1 tablet (40 mg total) by mouth 2 (two) times daily. 60 tablet 0  . simvastatin (ZOCOR) 40 MG tablet Take by mouth.    . spironolactone (ALDACTONE) 50 MG tablet Take 1 tablet by mouth daily.     No current  facility-administered medications for this visit.   Facility-Administered Medications Ordered in Other Visits  Medication Dose Route Frequency Provider Last Rate Last Dose  . sodium chloride 0.9 % injection 10 mL  10 mL Intravenous PRN Lloyd Huger, MD   10 mL at 11/11/14 1045  . sodium chloride 0.9 % injection 10 mL  10 mL Intravenous PRN Lloyd Huger, MD   10 mL at 05/11/15 1104    OBJECTIVE: Filed Vitals:   07/28/15 1034  BP: 116/64  Pulse: 72  Temp: 98.4 F (36.9 C)     Body mass index is 29 kg/(m^2).    ECOG FS:0 - Asymptomatic  General: Well-developed, well-nourished, no acute distress. Eyes: anicteric sclera. Neck: No palpable lymphadenopathy.  Lungs: Clear to auscultation bilaterally. Heart: Regular rate and rhythm. No rubs, murmurs, or gallops. Abdomen: Soft, nontender, nondistended. No organomegaly noted, normoactive bowel sounds. Musculoskeletal: No edema, cyanosis, or clubbing. Neuro: Alert, answering all questions appropriately. Cranial nerves grossly intact. Skin: No rashes or petechiae noted. Psych: Normal affect.   LAB RESULTS:  Lab Results  Component Value Date   NA 143 07/13/2015   K 3.5 07/13/2015   CL 107 07/13/2015   CO2 30 07/13/2015   GLUCOSE 102* 07/13/2015   BUN 16 07/13/2015   CREATININE 0.98 07/13/2015   CALCIUM 8.4* 07/13/2015   PROT 6.1* 07/13/2015   ALBUMIN 3.4* 07/13/2015   AST 45* 07/13/2015   ALT 18 07/13/2015   ALKPHOS 55 07/13/2015   BILITOT 1.0 07/13/2015   GFRNONAA >60 07/13/2015   GFRAA >60 07/13/2015    Lab Results  Component Value Date   WBC 3.4* 07/28/2015   NEUTROABS 2.3 07/28/2015   HGB 11.0* 07/28/2015   HCT 33.9* 07/28/2015   MCV 79.7* 07/28/2015   PLT 70* 07/28/2015     STUDIES: Dg Chest 2 View  07/13/2015  CLINICAL DATA:  Shortness of breath. EXAM: CHEST  2 VIEW COMPARISON:  CT chest 06/30/2015 FINDINGS: There is right apical pleural thickening. There is bilateral interstitial thickening. There  are trace bilateral pleural effusions. There is no pneumothorax. The heart and mediastinal contours are unremarkable. There is evidence of prior CABG. There is a right-sided Port-A-Cath in satisfactory position. The osseous structures are unremarkable. IMPRESSION: 1. Mild bilateral interstitial thickening and trace pleural effusions concerning for mild interstitial edema. Electronically Signed   By: Kathreen Devoid   On: 07/13/2015 11:08   Ct Head Wo Contrast  07/13/2015  CLINICAL DATA:  Hallucinations last night, recently changed 2 different medications, history esophageal cancer post chemotherapy and radiation therapy, coronary artery disease post CABG, cryptogenic cirrhosis with esophageal varices, diabetes mellitus EXAM: CT HEAD WITHOUT CONTRAST TECHNIQUE: Contiguous axial images were obtained from the base of the skull through the vertex without intravenous contrast. COMPARISON:  None FINDINGS: Minimal atrophy. Normal ventricular morphology. No midline shift or mass effect. Small old lacunar infarct at LEFT basal ganglia. No intracranial hemorrhage, mass lesion, or evidence acute infarction. No extra-axial fluid collections. Atherosclerotic calcifications at the carotid siphons. Bones and  sinuses unremarkable. 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 No acute intracranial abnormalities. Electronically Signed   By: Lavonia Dana M.D.   On: 07/13/2015 10:53   Ct Soft Tissue Neck W Contrast  06/30/2015  CLINICAL DATA:  Esophageal cancer.  Esophageal varices. EXAM: CT NECK WITH CONTRAST TECHNIQUE: Multidetector CT imaging of the neck was performed using the standard protocol following the bolus administration of intravenous contrast. CONTRAST:  175m OMNIPAQUE IOHEXOL 300 MG/ML  SOLN COMPARISON:  PET scan 08/12/2014.  Chest CT from the same day. FINDINGS: Pharynx and larynx: No focal mucosal or submucosal lesions are present. The left true vocal cord is positioned somewhat laterally. There is no  discrete lesion associated with the vocal cord. The tongue base is normal. Calcifications are present in the left palatine tonsil, likely representing a history of prior infection. The parapharyngeal fat is clear bilaterally. Salivary glands: The submandibular and parotid glands are normal bilaterally. Thyroid: The thyroid is atrophic without focal lesions. Lymph nodes: Somewhat ill-defined left supraclavicular lymph nodes measure up to 12 mm. These are more prominent than on the prior exam. There is stranding about these nodes no other significant cervical adenopathy is present. Vascular: A right IJ Port-A-Cath is in place. Atherosclerotic calcifications are present within the carotid bifurcations bilaterally. There is a moderate left-sided stenosis relative to the more distal vessel. Mild to moderate narrowing is present on the right. Atherosclerotic calcifications are present at the aortic arch without significant stenosis of the great vessels. Limited intracranial: Negative Visualized orbits: Within normal limits. Mastoids and visualized paranasal sinuses: Mild mucosal thickening is present along the floor of the maxillary sinuses bilaterally. A polyp or mucous retention cyst is noted anteriorly on the left. Skeleton: Multilevel endplate degenerative changes are noted. Uncovertebral spurring is present at C2-3 bilaterally. There is fusion across the endplates at CM0-8 There straightening of the normal cervical lordosis. Upper chest: Lung apices are clear. Bilateral pleural effusions are evident, right greater than left. No significant mediastinal adenopathy is present. IMPRESSION: 1. Left supraclavicular ill-defined lymph nodes have increased in size. This is concerning for metastatic disease. There is some interval growth since the last PET scan. 2. No other significant cervical adenopathy or focal esophageal lesion. 3. Atherosclerosis with left greater than right carotid stenosis. 4. Multilevel spondylosis in  the cervical spine. Electronically Signed   By: CSan MorelleM.D.   On: 06/30/2015 11:25   Ct Chest W Contrast  06/30/2015  CLINICAL DATA:  History of esophageal adenocarcinoma. EXAM: CT CHEST WITH CONTRAST TECHNIQUE: Multidetector CT imaging of the chest was performed during intravenous contrast administration. CONTRAST:  1056mOMNIPAQUE IOHEXOL 300 MG/ML  SOLN COMPARISON:  None. FINDINGS: Mediastinum/Nodes: No axillary or supraclavicular adenopathy. No mediastinal hilar adenopathy. No pericardial fluid. There is thickening of the distal esophagus over a for 5 cm segment with single wall thickening to 10 mm. No paraesophageal lymphadenopathy. Lungs/Pleura: Bilateral pleural effusions. No suspicious nodules. Mild basilar atelectasis. Airways are normal. Upper abdomen: Small amount of ascites surrounds the liver and spleen. Liver has a nodular contour. Adrenal glands are normal. No gastrohepatic ligament lymphadenopathy. Stomach is grossly normal. Musculoskeletal: No aggressive osseous lesion. IMPRESSION: 1. No evidence of esophageal cancer recurrence or metastasis in the thorax. 2. Thickened distal esophagus without mass lesion. 3. Bilateral pleural effusions and ascites with nodular liver suggests cirrhosis. Electronically Signed   By: StSuzy Bouchard.D.   On: 06/30/2015 12:48   Nm Pet Image Restag (ps) Skull Base To Thigh  07/23/2015  CLINICAL DATA:  Subsequent treatment strategy for malignant neoplasm of lower third of esophagus. Cirrhosis. No recent chemotherapy or radiation therapy. EXAM: NUCLEAR MEDICINE PET SKULL BASE TO THIGH TECHNIQUE: 11.7 mCi F-18 FDG was injected intravenously. Full-ring PET imaging was performed from the skull base to thigh after the radiotracer. CT data was obtained and used for attenuation correction and anatomic localization. FASTING BLOOD GLUCOSE:  Value: 85 mg/dl COMPARISON:  Neck CT of 06/30/2015. Chest CT 06/30/2015. Prior PET of 08/12/2014. FINDINGS: NECK Motion  between the PET and CT images. area of apparent left calvarial hypermetabolism is likely was registered and indicative of parietal scalp activity. This measures a S.U.V. max of 6.1. No CT correlate, including on approximately image 2/series 3. Apparent hypermetabolism about the skin of the right shoulder is likely image registered and arising from the region of the adjacent right sided neck. No well-defined skin lesion identified. No hypermetabolic cervical nodes. CHEST No hypermetabolic thoracic nodes, including within the left subcarinal station. The nodes detailed on prior neck CT are decreased in size today, likely reactive, including on image 53/series 3. Hypermetabolism is identified within the distal esophagus in the region of mild wall thickening. This measures a S.U.V. max of, 4.8 including on image 124/series 3. ABDOMEN/PELVIS No areas of abnormal hypermetabolism. SKELETON No abnormal marrow activity. CT IMAGES PERFORMED FOR ATTENUATION CORRECTION Mucosal thickening of right maxillary sinus. Left maxillary sinus mucous retention cyst or polyp. Neck findings deferred to recent diagnostic CT. Right Port-A-Cath terminates at the caval/ atrial junction. Mild bilateral gynecomastia. Chest findings deferred to recent diagnostic CT. Centrilobular emphysema. Mild cardiomegaly with median sternotomy. Cirrhosis with small volume perihepatic ascites. Portosystemic collaterals, consistent with portal venous hypertension. Extensive colonic diverticulosis. Trace cul-de-sac fluid, new. IMPRESSION: 1. The left supraclavicular nodes detailed on prior neck CT are decreased in size and not hypermetabolic, most consistent with a benign/reactive etiology. 2. Low-level hypermetabolism within the distal esophagus which is nonspecific and could be physiologic or treatment related. No well-defined esophageal mass. 3. Otherwise, no evidence of metastatic disease. 4. Misregistration between CT and PET images within the neck. Areas  of mild hypermetabolism in the region of the left parietal scalp and superficial low right neck are indeterminate. Consider physical exam correlation. 5. Cirrhosis and portal venous hypertension with development of small volume abdominal pelvic ascites since 08/12/2014. Electronically Signed   By: Abigail Miyamoto M.D.   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  PLAN:    1. Esophageal adenocarcinoma: Patient completed his chemotherapy and concurrent XRT on October 09, 2014. Pet scan reviewed independently and reported as above with lymph nodes seen on CT scan that are not hypermetabolic. Patient's EGD on 07/26/15 revealed grade one esophageal varices and an esophageal ulcer. Return to clinic in 3 months with laboratory work and further evaluation.   2. Thrombocytopenia: Stable. Likely multifactorial. Bone marrow biopsy was discussed with patient, but he is refusing this procedure at this time. Monitor.  3. Anemia: Hemoglobin increased since discharge from the hospital.  4. Leukopenia: Patient refusing bone marrow biopsy as above. Monitor.  Patient expressed understanding and was in agreement with this plan. He also understands that He can call clinic at any time with any questions, concerns, or complaints.    Mayra Reel, NP   07/28/2015 12:04 PM  Dr. Grayland Ormond was available for consultation and review of plan of care for this patient.   Lloyd Huger, MD 08/01/2015 7:43 AM

## 2015-09-08 ENCOUNTER — Inpatient Hospital Stay: Payer: Medicare Other | Attending: Oncology

## 2015-09-08 DIAGNOSIS — Z452 Encounter for adjustment and management of vascular access device: Secondary | ICD-10-CM | POA: Insufficient documentation

## 2015-09-08 DIAGNOSIS — Z95828 Presence of other vascular implants and grafts: Secondary | ICD-10-CM

## 2015-09-08 DIAGNOSIS — Z9221 Personal history of antineoplastic chemotherapy: Secondary | ICD-10-CM | POA: Insufficient documentation

## 2015-09-08 DIAGNOSIS — C160 Malignant neoplasm of cardia: Secondary | ICD-10-CM | POA: Diagnosis not present

## 2015-09-08 DIAGNOSIS — Z923 Personal history of irradiation: Secondary | ICD-10-CM | POA: Diagnosis not present

## 2015-09-08 MED ORDER — SODIUM CHLORIDE 0.9% FLUSH
10.0000 mL | INTRAVENOUS | Status: DC | PRN
  Administered 2015-09-08: 10 mL via INTRAVENOUS
  Filled 2015-09-08: qty 10

## 2015-09-08 MED ORDER — HEPARIN SOD (PORK) LOCK FLUSH 100 UNIT/ML IV SOLN
500.0000 [IU] | Freq: Once | INTRAVENOUS | Status: AC
  Administered 2015-09-08: 500 [IU] via INTRAVENOUS

## 2015-09-09 ENCOUNTER — Telehealth: Payer: Self-pay

## 2015-09-09 NOTE — Telephone Encounter (Signed)
  Oncology Nurse Navigator Documentation  Navigator Location: [facility] (09/09/15 0900) Navigator Encounter Type: Telephone (09/09/15 0900) Telephone: [name] Call (09/09/15 0900)             Barriers/Navigation Needs: Coordination of Care (09/09/15 0900)   Interventions: Coordination of Care (09/09/15 0900)   Coordination of Care: EUS (09/09/15 0900)        Acuity: Level 2 (09/09/15 0900)   Acuity Level 2: Initial guidance, education and coordination as needed;Educational needs;Ongoing guidance and education throughout treatment as needed (09/09/15 0900)     Time Spent with Patient: 30 (09/09/15 0900)   Received referral for EUS from Dr Rayann Heman for history of esophageal cancer and abnormal PET scan. Scheduled for 10-07-15 at [facility] with Dr Earlie Counts. Voicemail left for Mark Ford to return call to arrange with him and go over instructions.

## 2015-09-13 ENCOUNTER — Telehealth: Payer: Self-pay

## 2015-09-13 NOTE — Telephone Encounter (Addendum)
  Oncology Nurse Navigator Documentation  Navigator Location: [facility] (09/13/15 1000) Navigator Encounter Type: Telephone (09/13/15 1000) Telephone: Outgoing Call (09/13/15 1000)             Barriers/Navigation Needs: Coordination of Care (09/13/15 1000)   Interventions: Coordination of Care (09/13/15 1000)   Coordination of Care: EUS (09/13/15 1000)                  Time Spent with Patient: 15 (09/13/15 1000)   Voicemail left for Mark Ford to return call to arrange EUS. Scheduled for 10-07-15 at [facility]. Instructions mailed out to home address today. My contact information is attached. Will continue contact attempts.   INSTRUCTIONS FOR ENDOSCOPIC ULTRASOUND -Your procedure has been scheduled for April 27th with Dr Earlie Counts at [facility] will contact you to pre-register over the phone. If for any reason you have not received a call within one week prior to your scheduled procedure date, please call [phone]. -To get your scheduled arrival time, please call the Endoscopy unit at  [phone] between 1-3pm on: April 26th     -ON THE DAY OF YOU PROCEDURE:   1. If you are scheduled for a morning procedure, nothing to drink after midnight  -If you are scheduled for an afternoon procedure, you may have clear liquids until 5 hours prior  to the procedure but no carbonated drinks or broth  2. NO FOOD THE DAY OF YOUR PROCEDURE  3. You may take your heart, seizure, blood pressure, Parkinson's or breathing medications at  6am with just enough water to get your pills down  4. Do not take any oral Diabetic medications the morning of your procedure.  5. If you are a diabetic and are using insulin, please notify your prescribing physician of this  procedure as your dose may need to be altered related to not being able to eat or drink.   5. Do not take Vitamins     -On the day of your procedure, come to the [facility] Admitting/Registration desk (First desk on the  right) at the scheduled arrival time. You MUST have someone drive you home from your procedure. You must have a responsible adult with a valid drivers license who is on site throughout your entire procedure and who can stay with you for several hours after your procedure. You may not go home alone in a taxi, shuttle [place] or bus, as the drivers will not be responsible for you.

## 2015-09-15 ENCOUNTER — Telehealth: Payer: Self-pay

## 2015-09-15 NOTE — Telephone Encounter (Signed)
Oncology Nurse Navigator Documentation  Oncology Nurse Navigator Flowsheets 09/09/2015 09/13/2015 09/15/2015  Navigator Location [facility]  Navigator Encounter Type Telephone Telephone Telephone  Telephone Outgoing Call Outgoing Call Outgoing Call  Barriers/Navigation Needs Coordination of Care Coordination of Care Coordination of Care  Interventions Coordination of Care Coordination of Care Coordination of Care  Coordination of Care EUS EUS EUS  Acuity Level 2 - -  Acuity Level 2 Initial guidance, education and coordination as needed;Educational needs;Ongoing guidance and education throughout treatment as needed - -  Time Spent with Patient 30 15 15    Voicemail left for Mr Leppanen to return call for EUS scheduling. Instructions have been mailed to home address. Need to confirm date/procedure with him.

## 2015-09-20 ENCOUNTER — Telehealth: Payer: Self-pay

## 2015-09-20 NOTE — Telephone Encounter (Signed)
  Oncology Nurse Navigator Documentation  Navigator Location: [facility] (09/20/15 1100) Navigator Encounter Type: Telephone (09/20/15 1100) Telephone: Outgoing Call (09/20/15 1100)                     Coordination of Care: EUS (09/20/15 1100)                  Time Spent with Patient: 15 (09/20/15 1100)   Claiborne Billings at Dr Rein's office notified on inability to get in contact with Mr Seepersad for upcoming EUS

## 2015-09-20 NOTE — Telephone Encounter (Signed)
  Oncology Nurse Navigator Documentation  Navigator Location: [facility] (09/20/15 1134) Navigator Encounter Type: Telephone (09/20/15 1134) Telephone: Incoming Call (09/20/15 1134)             Barriers/Navigation Needs: Coordination of Care (09/20/15 1134)   Interventions: Coordination of Care (09/20/15 1134)   Coordination of Care: EUS (09/20/15 1134)                  Time Spent with Patient: 15 (09/20/15 1134)   Mark Ford at [facility] was able to reach patient and he returned call. Went over instructions for EUS. He has also received his copy in the mail. Denies anticoagulant use. Denies and further questions regarding EUS. He has my contact information for any future questions or concerns.

## 2015-10-04 ENCOUNTER — Ambulatory Visit
Admission: RE | Admit: 2015-10-04 | Discharge: 2015-10-04 | Disposition: A | Discharge status: Home / Self Care | Payer: Medicare Other | Source: Ambulatory Visit | Attending: Radiation Oncology | Admitting: Radiation Oncology

## 2015-10-04 ENCOUNTER — Encounter: Payer: Self-pay | Admitting: Radiation Oncology

## 2015-10-04 VITALS — BP 138/55 | HR 60 | Temp 97.4°F | Resp 20 | Wt 196.8 lb

## 2015-10-04 DIAGNOSIS — C155 Malignant neoplasm of lower third of esophagus: Secondary | ICD-10-CM

## 2015-10-04 NOTE — Progress Notes (Signed)
Radiation Oncology Follow up Note  Name: Mark Ford   Date:   10/04/2015 MRN:  [id number] DOB: Oct 26, 1945    This 70 y.o. 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 Patient refused surgical intervention after completion of chemoradiation. He is seen today in routine follow-up and is doing well. He specifically denies any weight loss dysphasia oriented other symptoms.. Recent PET scan in February showed left supraclavicular lymph nodes decreasing in size and not hypermetabolic. He did have low level hypermetabolic activity in the distal esophagus nonspecific. He did have is incidental finding cirrhosis and portal venous hypertension with development of small pelvic ascites. Clinically he is doing well.  COMPLICATIONS OF TREATMENT: none  FOLLOW UP COMPLIANCE: keeps appointments   PHYSICAL EXAM:  BP 138/55 mmHg  Pulse 60  Temp(Src) 97.4 F (36.3 C)  Resp 20  Wt 196 lb 12.2 oz (89.25 kg) Abdomen is benign with positive bowel sounds in all 4 quadrants. Well-developed well-nourished patient in NAD. HEENT reveals PERLA, EOMI, discs not visualized.  Oral cavity is clear. No oral mucosal lesions are identified. Neck is clear without evidence of cervical or supraclavicular adenopathy. Lungs are clear to A&P. Cardiac examination is essentially unremarkable with regular rate and rhythm without murmur rub or thrill. Abdomen is benign with no organomegaly or masses noted. Motor sensory and DTR levels are equal and symmetric in the upper and lower extremities. Cranial nerves II through XII are grossly intact. Proprioception is intact. No peripheral adenopathy or edema is  identified. No motor or sensory levels are noted. Crude visual fields are within normal range.  RADIOLOGY RESULTS: Recent PET CT scan is reviewed and compatible with the above-stated findings  PLAN: Present time patient is doing well with no evidence of disease by physical exam symptoms or PET CT criteria. I am please was overall progress. I've asked to see him back in about 6 months for follow-up. He continues close follow-up care with medical oncology. Patient is to call with any concerns.  I would like to take this opportunity for allowing me to participate in the care of your patient.Armstead Peaks., MD

## 2015-10-07 ENCOUNTER — Ambulatory Visit: Payer: Medicare Other | Admitting: Anesthesiology

## 2015-10-07 ENCOUNTER — Ambulatory Visit
Admission: RE | Admit: 2015-10-07 | Discharge: 2015-10-07 | Disposition: A | Discharge status: Home / Self Care | Payer: Medicare Other | Source: Ambulatory Visit | Attending: Gastroenterology | Admitting: Gastroenterology

## 2015-10-07 ENCOUNTER — Encounter: Payer: Self-pay | Admitting: Anesthesiology

## 2015-10-07 ENCOUNTER — Encounter
Admission: RE | Disposition: A | Discharge status: Home / Self Care | Payer: Self-pay | Source: Ambulatory Visit | Attending: Gastroenterology

## 2015-10-07 DIAGNOSIS — Z886 Allergy status to analgesic agent status: Secondary | ICD-10-CM | POA: Insufficient documentation

## 2015-10-07 DIAGNOSIS — I2510 Atherosclerotic heart disease of native coronary artery without angina pectoris: Secondary | ICD-10-CM | POA: Diagnosis not present

## 2015-10-07 DIAGNOSIS — Z923 Personal history of irradiation: Secondary | ICD-10-CM | POA: Insufficient documentation

## 2015-10-07 DIAGNOSIS — E039 Hypothyroidism, unspecified: Secondary | ICD-10-CM | POA: Insufficient documentation

## 2015-10-07 DIAGNOSIS — Z7984 Long term (current) use of oral hypoglycemic drugs: Secondary | ICD-10-CM | POA: Diagnosis not present

## 2015-10-07 DIAGNOSIS — K219 Gastro-esophageal reflux disease without esophagitis: Secondary | ICD-10-CM | POA: Diagnosis not present

## 2015-10-07 DIAGNOSIS — Z9221 Personal history of antineoplastic chemotherapy: Secondary | ICD-10-CM | POA: Insufficient documentation

## 2015-10-07 DIAGNOSIS — K766 Portal hypertension: Secondary | ICD-10-CM | POA: Diagnosis not present

## 2015-10-07 DIAGNOSIS — Z8249 Family history of ischemic heart disease and other diseases of the circulatory system: Secondary | ICD-10-CM | POA: Diagnosis not present

## 2015-10-07 DIAGNOSIS — K3189 Other diseases of stomach and duodenum: Secondary | ICD-10-CM | POA: Insufficient documentation

## 2015-10-07 DIAGNOSIS — I8500 Esophageal varices without bleeding: Secondary | ICD-10-CM | POA: Insufficient documentation

## 2015-10-07 DIAGNOSIS — K7460 Unspecified cirrhosis of liver: Secondary | ICD-10-CM | POA: Insufficient documentation

## 2015-10-07 DIAGNOSIS — Z951 Presence of aortocoronary bypass graft: Secondary | ICD-10-CM | POA: Diagnosis not present

## 2015-10-07 DIAGNOSIS — Z79899 Other long term (current) drug therapy: Secondary | ICD-10-CM | POA: Diagnosis not present

## 2015-10-07 DIAGNOSIS — I509 Heart failure, unspecified: Secondary | ICD-10-CM | POA: Insufficient documentation

## 2015-10-07 DIAGNOSIS — C159 Malignant neoplasm of esophagus, unspecified: Secondary | ICD-10-CM | POA: Diagnosis present

## 2015-10-07 DIAGNOSIS — Z91013 Allergy to seafood: Secondary | ICD-10-CM | POA: Insufficient documentation

## 2015-10-07 DIAGNOSIS — E785 Hyperlipidemia, unspecified: Secondary | ICD-10-CM | POA: Insufficient documentation

## 2015-10-07 DIAGNOSIS — Z8501 Personal history of malignant neoplasm of esophagus: Secondary | ICD-10-CM | POA: Diagnosis not present

## 2015-10-07 DIAGNOSIS — E119 Type 2 diabetes mellitus without complications: Secondary | ICD-10-CM | POA: Diagnosis not present

## 2015-10-07 HISTORY — PX: EUS: SHX5427

## 2015-10-07 LAB — GLUCOSE, CAPILLARY: GLUCOSE-CAPILLARY: 104 mg/dL — AB (ref 65–99)

## 2015-10-07 SURGERY — UPPER ENDOSCOPIC ULTRASOUND (EUS) LINEAR
Anesthesia: General

## 2015-10-07 MED ORDER — EPHEDRINE SULFATE 50 MG/ML IJ SOLN
INTRAMUSCULAR | Status: DC | PRN
  Administered 2015-10-07 (×3): 5 mg via INTRAVENOUS

## 2015-10-07 MED ORDER — SODIUM CHLORIDE 0.9 % IV SOLN
INTRAVENOUS | Status: DC
  Administered 2015-10-07: 1000 mL via INTRAVENOUS

## 2015-10-07 MED ORDER — LIDOCAINE HCL (CARDIAC) 20 MG/ML IV SOLN
INTRAVENOUS | Status: DC | PRN
  Administered 2015-10-07: 30 mg via INTRAVENOUS

## 2015-10-07 MED ORDER — PROPOFOL 500 MG/50ML IV EMUL
INTRAVENOUS | Status: DC | PRN
  Administered 2015-10-07: 120 ug/kg/min via INTRAVENOUS

## 2015-10-07 MED ORDER — FENTANYL CITRATE (PF) 100 MCG/2ML IJ SOLN
INTRAMUSCULAR | Status: DC | PRN
  Administered 2015-10-07: 50 ug via INTRAVENOUS

## 2015-10-07 MED ORDER — MIDAZOLAM HCL 2 MG/2ML IJ SOLN
INTRAMUSCULAR | Status: DC | PRN
  Administered 2015-10-07: 1 mg via INTRAVENOUS

## 2015-10-07 NOTE — Transfer of Care (Signed)
Immediate Anesthesia Transfer of Care Note  Patient: Mark Ford  Procedure(s) Performed: Procedure(s): UPPER ENDOSCOPIC ULTRASOUND (EUS) LINEAR (N/A)  Patient Location: PACU  Anesthesia Type:General  Level of Consciousness: awake and sedated  Airway & Oxygen Therapy: Patient Spontanous Breathing and Patient connected to nasal cannula oxygen  Post-op Assessment: Report given to RN and Post -op Vital signs reviewed and stable  Post vital signs: Reviewed and stable  Last Vitals:  Filed Vitals:   10/07/15 1237 10/07/15 1344  BP: 140/50 113/54  Pulse: 59 72  Temp: 36.4 C 36.3 C  Resp: 20 20    Last Pain: There were no vitals filed for this visit.       Complications: No apparent anesthesia complications

## 2015-10-07 NOTE — Anesthesia Postprocedure Evaluation (Signed)
Anesthesia Post Note  Patient: Mark Ford  Procedure(s) Performed: Procedure(s) (LRB): UPPER ENDOSCOPIC ULTRASOUND (EUS) LINEAR (N/A)  Patient location during evaluation: Endoscopy Anesthesia Type: General Level of consciousness: awake and alert Pain management: pain level controlled Vital Signs Assessment: post-procedure vital signs reviewed and stable Respiratory status: spontaneous breathing, nonlabored ventilation, respiratory function stable and patient connected to nasal cannula oxygen Cardiovascular status: blood pressure returned to baseline and stable Postop Assessment: no signs of nausea or vomiting Anesthetic complications: no    Last Vitals:  Filed Vitals:   10/07/15 1404 10/07/15 1414  BP: 127/57 132/60  Pulse: 70 67  Temp:    Resp: 16 20    Last Pain: There were no vitals filed for this visit.               Martha Clan

## 2015-10-07 NOTE — Anesthesia Preprocedure Evaluation (Signed)
Anesthesia Evaluation  Patient identified by MRN, date of birth, ID band Patient awake    Reviewed: Allergy & Precautions, H&P , NPO status , Patient's Chart, lab work & pertinent test results, reviewed documented beta blocker date and time   History of Anesthesia Complications Negative for: history of anesthetic complications  Airway Mallampati: II  TM Distance: >3 FB Neck ROM: full    Dental no notable dental hx. (+) Poor Dentition   Pulmonary neg pulmonary ROS,    Pulmonary exam normal breath sounds clear to auscultation       Cardiovascular Exercise Tolerance: Good hypertension, (-) angina+ CAD and + CABG  (-) Past MI and (-) Cardiac Stents Normal cardiovascular exam(-) dysrhythmias (-) Valvular Problems/Murmurs Rhythm:regular Rate:Normal     Neuro/Psych negative neurological ROS  negative psych ROS   GI/Hepatic GERD  ,(+) Cirrhosis   Esophageal Varices    ,   Endo/Other  diabetesHypothyroidism   Renal/GU negative Renal ROS  negative genitourinary   Musculoskeletal   Abdominal   Peds  Hematology negative hematology ROS (+)   Anesthesia Other Findings Past Medical History:   Cancer ([place])                                                   Comment:adenocarcinoma fo the GE Junction   GERD (gastroesophageal reflux disease)                       Diabetes mellitus without complication (HCC)                 Thyroid disease                                                Comment:hypothroidism   CAD (coronary artery disease)                                S/P CABG (coronary artery bypass graft)                      Hyperlipidemia                                               Cirrhosis, cryptogenic (HCC)                                 Esophageal varices (HCC)                                     Heart failure (HCC)                                          Reproductive/Obstetrics negative OB ROS  Anesthesia Physical Anesthesia Plan  ASA: III  Anesthesia Plan: General   Post-op Pain Management:    Induction:   Airway Management Planned:   Additional Equipment:   Intra-op Plan:   Post-operative Plan:   Informed Consent: I have reviewed the patients History and Physical, chart, labs and discussed the procedure including the risks, benefits and alternatives for the proposed anesthesia with the patient or authorized representative who has indicated his/her understanding and acceptance.   Dental Advisory Given  Plan Discussed with: Anesthesiologist, CRNA and Surgeon  Anesthesia Plan Comments:         Anesthesia Quick Evaluation

## 2015-10-07 NOTE — Anesthesia Procedure Notes (Signed)
Performed by: COOK-MARTIN, Celso Granja Pre-anesthesia Checklist: Patient identified, Emergency Drugs available, Suction available, Patient being monitored and Timeout performed Patient Re-evaluated:Patient Re-evaluated prior to inductionOxygen Delivery Method: Nasal cannula Preoxygenation: Pre-oxygenation with 100% oxygen Intubation Type: IV induction Airway Equipment and Method: Bite block Placement Confirmation: positive ETCO2 and CO2 detector     

## 2015-10-07 NOTE — Op Note (Signed)
[facility]ology Patient Name: Mark Ford Procedure Date: 10/07/2015 1:07 PM MRN: [id number] Account #: [id number] Date of Birth: 1946/04/14 Admit Type: Outpatient Age: 70 Room: [facility] ENDO ROOM 3 Gender: Male Note Status: Finalized Procedure:            Upper EUS Indications:          Abnormal abdominal PET scan, Esophageal                        adenocarcinoma/Post-chemotherapy, Esophageal                        adenocarcinoma/Post-radiation Providers:            Christian Mate. Earlie Counts, MD Referring MD:         Irven Easterly. Kary Kos, MD (Referring MD), Gerrit Heck. Rayann Heman,                        MD (Referring MD), Kathlene November. Grayland Ormond, MD (Referring                        MD), Noreene Filbert, MD (Referring MD) Medicines:            Monitored Anesthesia Care Complications:        No immediate complications. Procedure:            Pre-Anesthesia Assessment:                       - Prior to the procedure, a History and Physical was                        performed, and patient medications, allergies and                        sensitivities were reviewed. The patient's tolerance of                        previous anesthesia was reviewed.                       After obtaining informed consent, the endoscope was                        passed under direct vision. Throughout the procedure,                        the patient's blood pressure, pulse, and oxygen                        saturations were monitored continuously. The EUS GI                        Radial Array [id number] was introduced through the mouth,                        and advanced to the second part of duodenum. Findings:      Endoscopic Finding :      Grade I, small (< 5 mm) varices were found in the lower third of the       esophagus.      There is no endoscopic evidence of ulcerations or mass  in the entire       esophagus.      There is no endoscopic evidence of mass in the gastroesophageal   junction, in the cardia and in the gastric fundus.      Mild portal hypertensive gastropathy was found in the entire examined       stomach.      The examined duodenum was endoscopically normal.      Endosonographic Finding :      There was no sign of significant endosonographic abnormality in the       esophagus. No pathologic lymphadenopathy and no masses were identified.      Endosonographic imaging in the entire stomach showed no intramural       (subepithelial) lesion, no mass and no wall thickening.      No abnormal-appearing lymph nodes were seen during endosonographic       examination in the middle paraesophageal region (level 73M), in the lower       paraesophageal region (level 8L), in the paracardial region (level 16),       in the gastrohepatic ligament (level 18), in the celiac region (level       20) and in the perigastric region.      Endosonographic imaging in the visualized portion of the liver showed no       mass. Impression:           - Grade I and small (< 5 mm) esophageal varices.                       - Portal hypertensive gastropathy.                       - Normal post treatment upper EUS. No evidence of tumor                        recurrence.                       - No specimens collected. Recommendation:       - Patient has a contact number available for                        emergencies. The signs and symptoms of potential                        delayed complications were discussed with the patient.                        Return to normal activities tomorrow. Written discharge                        instructions were provided to the patient.                       - Continue present medications.                       - Resume previous diet.                       - Return to referring physicians. Procedure Code(s):    --- Professional ---  [id number], Esophagogastroduodenoscopy, flexible, transoral;                        with endoscopic  ultrasound examination, including the                        esophagus, stomach, and either the duodenum or a                        surgically altered stomach where the jejunum is                        examined distal to the anastomosis Diagnosis Code(s):    --- Professional ---                       C15.9, Malignant neoplasm of esophagus, unspecified                       Z08, Encounter for follow-up examination after                        completed treatment for malignant neoplasm                       R93.5, Abnormal findings on diagnostic imaging of other                        abdominal regions, including retroperitoneum                       K76.6, Portal hypertension                       I85.00, Esophageal varices without bleeding                       K31.89, Other diseases of stomach and duodenum CPT copyright 2016 American Medical Association. All rights reserved. The codes documented in this report are preliminary and upon coder review may  be revised to meet current compliance requirements. Attending Participation:      I personally performed the entire procedure without the assistance of a       fellow, resident or surgical assistant. Lindie Spruce, MD 10/07/2015 1:51:07 PM This report has been signed electronically. Number of Addenda: 0 Note Initiated On: 10/07/2015 1:07 PM      [facility]

## 2015-10-07 NOTE — H&P (Signed)
Primary Care Physician:  Maryland Pink, MD  Pre-Procedure History & Physical: HPI:  Mark Ford is a 70 y.o. male is here for an upper EUS.   Past Medical History  Diagnosis Date  . Cancer [facility])     adenocarcinoma fo the GE Junction  . GERD (gastroesophageal reflux disease)   . Diabetes mellitus without complication ([place])   . Thyroid disease     hypothroidism  . CAD (coronary artery disease)   . S/P CABG (coronary artery bypass graft)   . Hyperlipidemia   . Cirrhosis, cryptogenic ([place])   . Esophageal varices ([place])   . Heart failure [facility])     Past Surgical History  Procedure Laterality Date  . Coronary artery bypass graft    . Esophagogastroduodenoscopy (egd) with propofol N/A 02/11/2015    Procedure: ESOPHAGOGASTRODUODENOSCOPY (EGD) WITH PROPOFOL;  Surgeon: Manya Silvas, MD;  Location: [facility];  Service: Endoscopy;  Laterality: N/A;  . Esophagogastroduodenoscopy (egd) with propofol N/A 06/23/2015    Procedure: ESOPHAGOGASTRODUODENOSCOPY (EGD) WITH PROPOFOL;  Surgeon: Josefine Class, MD;  Location: [facility] ENDOSCOPY;  Service: Endoscopy;  Laterality: N/A;  . Esophagogastroduodenoscopy (egd) with propofol N/A 07/26/2015    Procedure: ESOPHAGOGASTRODUODENOSCOPY (EGD) WITH PROPOFOL;  Surgeon: Josefine Class, MD;  Location: [place] ENDOSCOPY;  Service: Endoscopy;  Laterality: N/A;    Prior to Admission medications   Medication Sig Start Date End Date Taking? Authorizing Provider  allopurinol (ZYLOPRIM) 300 MG tablet Take 300 mg by mouth daily. Take in the morning   Yes Historical Provider, MD  glipiZIDE (GLUCOTROL XL) 10 MG 24 hr tablet Take 10 mg by mouth daily with breakfast.   Yes Historical Provider, MD  levothyroxine (SYNTHROID, LEVOTHROID) 100 MCG tablet Take 100 mcg by mouth daily before breakfast.  08/24/14  Yes Historical Provider, MD  metFORMIN (GLUCOPHAGE) 1000 MG tablet Take 1,000 mg by mouth 2 (two) times daily with a meal.   Yes Historical Provider, MD   nadolol (CORGARD) 20 MG tablet 1/2 tablet orally every evening 06/25/15  Yes Loletha Grayer, MD  pantoprazole (PROTONIX) 40 MG tablet Take 1 tablet (40 mg total) by mouth 2 (two) times daily. 06/25/15  Yes Loletha Grayer, MD  simvastatin (ZOCOR) 40 MG tablet Take by mouth. 07/14/15  Yes Historical Provider, MD  spironolactone (ALDACTONE) 50 MG tablet Take 1 tablet by mouth daily. 07/06/15 07/05/16 Yes Historical Provider, MD    Allergies as of 09/14/2015 - Review Complete 07/28/2015  Allergen Reaction Noted  . Shellfish allergy Other (See Comments) 10/20/2014  . Tylenol [acetaminophen] Other (See Comments) 10/20/2014    Family History  Problem Relation Age of Onset  . Hypertension      Social History   Social History  . Marital Status: Married    Spouse Name: N/A  . Number of Children: N/A  . Years of Education: N/A   Occupational History  . Not on file.   Social History Main Topics  . Smoking status: Never Smoker   . Smokeless tobacco: Never Used  . Alcohol Use: No  . Drug Use: No  . Sexual Activity: Not on file   Other Topics Concern  . Not on file   Social History Narrative    Review of Systems: See HPI, otherwise negative ROS  Physical Exam: BP 140/50 mmHg  Pulse 59  Temp(Src) 97.6 F (36.4 C) (Tympanic)  Resp 20  Ht 5\' 6"  (1.676 m)  Wt 88.451 kg (195 lb)  BMI 31.49 kg/m2  SpO2 100% General:  Alert,  pleasant and cooperative in NAD Head:  Normocephalic and atraumatic. Neck:  Supple; no masses or thyromegaly. Lungs:  Clear throughout to auscultation.    Heart:  Regular rate and rhythm. Abdomen:  Soft, nontender and nondistended. Normal bowel sounds, without guarding, and without rebound.   Neurologic:  Alert and  oriented x4;  grossly normal neurologically.  Impression/Plan: Mark Ford is here for an Upper EUS to be performed for post-treatment surveillance/staging of GE junction malignancy; abnormal PET/CT  Risks, benefits, limitations, and  alternatives regarding  Upper EUS have been reviewed with the patient.  Questions have been answered.  All parties agreeable.   Cora Daniels, MD  10/07/2015, 12:59 PM

## 2015-10-08 ENCOUNTER — Encounter: Payer: Self-pay | Admitting: Gastroenterology

## 2015-10-11 NOTE — Progress Notes (Signed)
  Oncology Nurse Navigator Documentation  Navigator Location: [facility] (10/11/15 1100) Navigator Encounter Type: Diagnostic Results (10/11/15 1100) Telephone: Diagnostic Results (10/11/15 1100)             Barriers/Navigation Needs: Coordination of Care (10/11/15 1100)   Interventions: Coordination of Care (10/11/15 1100)   Coordination of Care: EUS (10/11/15 1100)                  Time Spent with Patient: 15 (10/11/15 1100)   Results of EUS from 10-07-15 routed to Dr Rayann Heman

## 2015-10-26 ENCOUNTER — Inpatient Hospital Stay: Payer: Medicare Other | Attending: Oncology

## 2015-10-26 ENCOUNTER — Inpatient Hospital Stay (HOSPITAL_BASED_OUTPATIENT_CLINIC_OR_DEPARTMENT_OTHER): Payer: Medicare Other | Admitting: Oncology

## 2015-10-26 VITALS — BP 146/77 | HR 64 | Temp 98.5°F | Resp 16 | Wt 209.0 lb

## 2015-10-26 DIAGNOSIS — I2510 Atherosclerotic heart disease of native coronary artery without angina pectoris: Secondary | ICD-10-CM

## 2015-10-26 DIAGNOSIS — Z79899 Other long term (current) drug therapy: Secondary | ICD-10-CM | POA: Insufficient documentation

## 2015-10-26 DIAGNOSIS — Z7984 Long term (current) use of oral hypoglycemic drugs: Secondary | ICD-10-CM | POA: Insufficient documentation

## 2015-10-26 DIAGNOSIS — K7460 Unspecified cirrhosis of liver: Secondary | ICD-10-CM

## 2015-10-26 DIAGNOSIS — E039 Hypothyroidism, unspecified: Secondary | ICD-10-CM | POA: Insufficient documentation

## 2015-10-26 DIAGNOSIS — D696 Thrombocytopenia, unspecified: Secondary | ICD-10-CM | POA: Insufficient documentation

## 2015-10-26 DIAGNOSIS — K2210 Ulcer of esophagus without bleeding: Secondary | ICD-10-CM

## 2015-10-26 DIAGNOSIS — E785 Hyperlipidemia, unspecified: Secondary | ICD-10-CM | POA: Insufficient documentation

## 2015-10-26 DIAGNOSIS — D72819 Decreased white blood cell count, unspecified: Secondary | ICD-10-CM | POA: Diagnosis not present

## 2015-10-26 DIAGNOSIS — C160 Malignant neoplasm of cardia: Secondary | ICD-10-CM | POA: Diagnosis not present

## 2015-10-26 DIAGNOSIS — I8500 Esophageal varices without bleeding: Secondary | ICD-10-CM

## 2015-10-26 DIAGNOSIS — Z951 Presence of aortocoronary bypass graft: Secondary | ICD-10-CM | POA: Diagnosis not present

## 2015-10-26 DIAGNOSIS — C155 Malignant neoplasm of lower third of esophagus: Secondary | ICD-10-CM

## 2015-10-26 DIAGNOSIS — D649 Anemia, unspecified: Secondary | ICD-10-CM

## 2015-10-26 DIAGNOSIS — E119 Type 2 diabetes mellitus without complications: Secondary | ICD-10-CM | POA: Insufficient documentation

## 2015-10-26 DIAGNOSIS — Z923 Personal history of irradiation: Secondary | ICD-10-CM | POA: Insufficient documentation

## 2015-10-26 DIAGNOSIS — K219 Gastro-esophageal reflux disease without esophagitis: Secondary | ICD-10-CM | POA: Diagnosis not present

## 2015-10-26 DIAGNOSIS — Z9221 Personal history of antineoplastic chemotherapy: Secondary | ICD-10-CM | POA: Diagnosis not present

## 2015-10-26 DIAGNOSIS — C159 Malignant neoplasm of esophagus, unspecified: Secondary | ICD-10-CM

## 2015-10-26 LAB — CBC WITH DIFFERENTIAL/PLATELET
BASOS PCT: 0 %
Basophils Absolute: 0 10*3/uL (ref 0–0.1)
Eosinophils Absolute: 0.1 10*3/uL (ref 0–0.7)
Eosinophils Relative: 2 %
HEMATOCRIT: 28.3 % — AB (ref 40.0–52.0)
HEMOGLOBIN: 9.1 g/dL — AB (ref 13.0–18.0)
LYMPHS PCT: 20 %
Lymphs Abs: 0.6 10*3/uL — ABNORMAL LOW (ref 1.0–3.6)
MCH: 25.3 pg — ABNORMAL LOW (ref 26.0–34.0)
MCHC: 32.1 g/dL (ref 32.0–36.0)
MCV: 78.8 fL — AB (ref 80.0–100.0)
MONO ABS: 0.3 10*3/uL (ref 0.2–1.0)
MONOS PCT: 9 %
NEUTROS ABS: 2 10*3/uL (ref 1.4–6.5)
NEUTROS PCT: 69 %
Platelets: 67 10*3/uL — ABNORMAL LOW (ref 150–440)
RBC: 3.6 MIL/uL — ABNORMAL LOW (ref 4.40–5.90)
RDW: 20 % — AB (ref 11.5–14.5)
WBC: 3 10*3/uL — ABNORMAL LOW (ref 3.8–10.6)

## 2015-10-26 NOTE — Progress Notes (Signed)
Patient does not offer any problems today.  

## 2015-10-26 NOTE — Progress Notes (Signed)
[place]  [phone] [phone]  ID: Mark Ford OB: 1945-07-10  MR#: [id number]  QHU#:[id number]  Patient Care Team: [place] Pink, MD as PCP - General (Family Medicine) Manya Silvas, MD (Gastroenterology)  CHIEF COMPLAINT:  Chief Complaint  Patient presents with  . Esophageal Cancer    INTERVAL HISTORY: Patient returns to clinic today for further evaluation. He currently feels well and asymptomatic. He has no neurologic complaints. He denies any fevers. He denies any weight loss. He has no chest pain or shortness of breath. He denies any constipation or diarrhea. He has no further melena or hematochezia. He denies any pain. He has no urinary complaints. Patient offers no specific complaints today.   REVIEW OF SYSTEMS:   Review of Systems  Constitutional: Negative for fever and malaise/fatigue.  HENT: Negative.   Respiratory: Negative.   Cardiovascular: Negative.   Gastrointestinal: Negative.  Negative for blood in stool and melena.  Musculoskeletal: Negative.   Neurological: Negative for weakness.  Endo/Heme/Allergies: Does not bruise/bleed easily.    As per HPI. Otherwise, a complete review of systems is negatve.  PAST MEDICAL HISTORY: Past Medical History  Diagnosis Date  . Cancer [facility])     adenocarcinoma fo the GE Junction  . GERD (gastroesophageal reflux disease)   . Diabetes mellitus without complication ([place])   . Thyroid disease     hypothroidism  . CAD (coronary artery disease)   . S/P CABG (coronary artery bypass graft)   . Hyperlipidemia   . 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 Esophageal varices ([place])   . Heart failure ([place])     PAST SURGICAL HISTORY: Past Surgical History  Procedure Laterality Date  . Coronary artery bypass graft    . Esophagogastroduodenoscopy (egd) with propofol N/A 02/11/2015    Procedure: ESOPHAGOGASTRODUODENOSCOPY (EGD) WITH PROPOFOL;  Surgeon: Manya Silvas, MD;  Location: [place];  Service: Endoscopy;  Laterality: N/A;  . Esophagogastroduodenoscopy (egd) with propofol N/A 06/23/2015    Procedure: ESOPHAGOGASTRODUODENOSCOPY (EGD) WITH PROPOFOL;  Surgeon: Josefine Class, MD;  Location: [facility] ENDOSCOPY;  Service: Endoscopy;  Laterality: N/A;  . Esophagogastroduodenoscopy (egd) with propofol N/A 07/26/2015    Procedure: ESOPHAGOGASTRODUODENOSCOPY (EGD) WITH PROPOFOL;  Surgeon: Josefine Class, MD;  Location: [facility] ENDOSCOPY;  Service: Endoscopy;  Laterality: N/A;  . Eus N/A 10/07/2015    Procedure: UPPER ENDOSCOPIC ULTRASOUND (EUS) LINEAR;  Surgeon: Cora Daniels, MD;  Location: [facility] ENDOSCOPY;  Service: Endoscopy;  Laterality: N/A;    FAMILY HISTORY Family History  Problem Relation Age of Onset  . Hypertension         ADVANCED DIRECTIVES:    HEALTH MAINTENANCE: Social History  Substance Use Topics  . Smoking status: Never Smoker   . Smokeless tobacco: Never Used  . Alcohol Use: No     Allergies  Allergen Reactions  . Shellfish Allergy Other (See Comments)    GOUT  . Tylenol [Acetaminophen] Other (See Comments)    Not allergic, but was told not to take it any more due to his liver condition.     Current Outpatient Prescriptions  Medication Sig Dispense Refill  . allopurinol (ZYLOPRIM) 300 MG tablet Take 300 mg by mouth daily. Take in the morning    . glipiZIDE (GLUCOTROL XL) 10 MG 24 hr tablet Take 10 mg by mouth daily with breakfast.    . levothyroxine (SYNTHROID, LEVOTHROID) 100 MCG tablet Take 100 mcg by mouth daily before breakfast.     . metFORMIN (GLUCOPHAGE) 1000  MG tablet Take 1,000 mg by mouth 2 (two) times daily with a meal.    . nadolol (CORGARD) 20 MG tablet 1/2 tablet orally every evening 15 tablet 0  . pantoprazole (PROTONIX) 40 MG tablet Take 1 tablet (40 mg total) by mouth 2 (two) times daily. 60 tablet 0  . simvastatin (ZOCOR) 40 MG tablet Take by mouth.    . spironolactone (ALDACTONE) 50 MG tablet Take 1 tablet  by mouth daily.     No current facility-administered medications for this visit.   Facility-Administered Medications Ordered in Other Visits  Medication Dose Route Frequency Provider Last Rate Last Dose  . sodium chloride 0.9 % injection 10 mL  10 mL Intravenous PRN Lloyd Huger, MD   10 mL at 11/11/14 1045  . sodium chloride 0.9 % injection 10 mL  10 mL Intravenous PRN Lloyd Huger, MD   10 mL at 05/11/15 1104    OBJECTIVE: Filed Vitals:   10/26/15 1153  BP: 146/77  Pulse: 64  Temp: 98.5 F (36.9 C)  Resp: 16     Body mass index is 33.75 kg/(m^2).    ECOG FS:0 - Asymptomatic  General: Well-developed, well-nourished, no acute distress. Eyes: anicteric sclera. Neck: No palpable lymphadenopathy.  Lungs: Clear to auscultation bilaterally. Heart: Regular rate and rhythm. No rubs, murmurs, or gallops. Abdomen: Soft, nontender, nondistended. No organomegaly noted, normoactive bowel sounds. Musculoskeletal: No edema, cyanosis, or clubbing. Neuro: Alert, answering all questions appropriately. Cranial nerves grossly intact. Skin: No rashes or petechiae noted. Psych: Normal affect.   LAB RESULTS:  Lab Results  Component Value Date   NA 143 07/13/2015   K 3.5 07/13/2015   CL 107 07/13/2015   CO2 30 07/13/2015   GLUCOSE 102* 07/13/2015   BUN 16 07/13/2015   CREATININE 0.98 07/13/2015   CALCIUM 8.4* 07/13/2015   PROT 6.1* 07/13/2015   ALBUMIN 3.4* 07/13/2015   AST 45* 07/13/2015   ALT 18 07/13/2015   ALKPHOS 55 07/13/2015   BILITOT 1.0 07/13/2015   GFRNONAA >60 07/13/2015   GFRAA >60 07/13/2015    Lab Results  Component Value Date   WBC 3.0* 10/26/2015   NEUTROABS 2.0 10/26/2015   HGB 9.1* 10/26/2015   HCT 28.3* 10/26/2015   MCV 78.8* 10/26/2015   PLT 67* 10/26/2015     STUDIES: No results found.  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  PLAN:    1. Esophageal adenocarcinoma: Patient completed his chemotherapy and concurrent XRT  on October 09, 2014. Patient's EGD on 07/26/15 revealed grade one esophageal varices and an esophageal ulcer. Return to clinic in 4 months with laboratory work and further evaluation.  2. Thrombocytopenia: Platelets 67 today. Stable. Likely multifactorial. Bone marrow biopsy was discussed with patient, but he is refusing this procedure at this time. Monitor.  3. Anemia: Hemoglobin 9.1 today. Monitor 4. Leukopenia: WBC 3.0 today. Patient refusing bone marrow biopsy as above. Monitor.  Patient expressed understanding and was in agreement with this plan. He also understands that He can call clinic at any time with any questions, concerns, or complaints.    Mayra Reel, NP   10/26/2015 12:20 PM  Patient was seen and evaluated independently and I agree with the assessment and plan as dictated above.  Lloyd Huger, MD 10/29/2015 11:39 AM

## 2016-01-04 ENCOUNTER — Other Ambulatory Visit: Payer: Self-pay | Admitting: Nurse Practitioner

## 2016-01-04 DIAGNOSIS — K7460 Unspecified cirrhosis of liver: Secondary | ICD-10-CM

## 2016-01-05 ENCOUNTER — Emergency Department: Payer: Medicare Other

## 2016-01-05 ENCOUNTER — Inpatient Hospital Stay
Admission: EM | Admit: 2016-01-05 | Discharge: 2016-01-11 | DRG: 871 | Disposition: E | Payer: Medicare Other | Attending: Internal Medicine | Admitting: Internal Medicine

## 2016-01-05 ENCOUNTER — Encounter: Payer: Self-pay | Admitting: Emergency Medicine

## 2016-01-05 DIAGNOSIS — E872 Acidosis: Secondary | ICD-10-CM | POA: Diagnosis present

## 2016-01-05 DIAGNOSIS — D61818 Other pancytopenia: Secondary | ICD-10-CM | POA: Diagnosis present

## 2016-01-05 DIAGNOSIS — J189 Pneumonia, unspecified organism: Secondary | ICD-10-CM | POA: Diagnosis present

## 2016-01-05 DIAGNOSIS — R0603 Acute respiratory distress: Secondary | ICD-10-CM

## 2016-01-05 DIAGNOSIS — R52 Pain, unspecified: Secondary | ICD-10-CM

## 2016-01-05 DIAGNOSIS — R0600 Dyspnea, unspecified: Secondary | ICD-10-CM

## 2016-01-05 DIAGNOSIS — K766 Portal hypertension: Secondary | ICD-10-CM | POA: Diagnosis present

## 2016-01-05 DIAGNOSIS — Z79899 Other long term (current) drug therapy: Secondary | ICD-10-CM | POA: Diagnosis not present

## 2016-01-05 DIAGNOSIS — J9601 Acute respiratory failure with hypoxia: Secondary | ICD-10-CM | POA: Diagnosis present

## 2016-01-05 DIAGNOSIS — N170 Acute kidney failure with tubular necrosis: Secondary | ICD-10-CM | POA: Diagnosis present

## 2016-01-05 DIAGNOSIS — E1122 Type 2 diabetes mellitus with diabetic chronic kidney disease: Secondary | ICD-10-CM | POA: Diagnosis present

## 2016-01-05 DIAGNOSIS — R6520 Severe sepsis without septic shock: Secondary | ICD-10-CM | POA: Diagnosis present

## 2016-01-05 DIAGNOSIS — Z951 Presence of aortocoronary bypass graft: Secondary | ICD-10-CM | POA: Diagnosis not present

## 2016-01-05 DIAGNOSIS — R0682 Tachypnea, not elsewhere classified: Secondary | ICD-10-CM | POA: Diagnosis present

## 2016-01-05 DIAGNOSIS — N179 Acute kidney failure, unspecified: Secondary | ICD-10-CM

## 2016-01-05 DIAGNOSIS — I509 Heart failure, unspecified: Secondary | ICD-10-CM | POA: Diagnosis present

## 2016-01-05 DIAGNOSIS — N189 Chronic kidney disease, unspecified: Secondary | ICD-10-CM | POA: Diagnosis present

## 2016-01-05 DIAGNOSIS — R188 Other ascites: Secondary | ICD-10-CM | POA: Diagnosis present

## 2016-01-05 DIAGNOSIS — Z9889 Other specified postprocedural states: Secondary | ICD-10-CM

## 2016-01-05 DIAGNOSIS — K7469 Other cirrhosis of liver: Secondary | ICD-10-CM | POA: Diagnosis present

## 2016-01-05 DIAGNOSIS — I2510 Atherosclerotic heart disease of native coronary artery without angina pectoris: Secondary | ICD-10-CM | POA: Diagnosis present

## 2016-01-05 DIAGNOSIS — G9340 Encephalopathy, unspecified: Secondary | ICD-10-CM | POA: Diagnosis present

## 2016-01-05 DIAGNOSIS — R4182 Altered mental status, unspecified: Secondary | ICD-10-CM

## 2016-01-05 DIAGNOSIS — Z7984 Long term (current) use of oral hypoglycemic drugs: Secondary | ICD-10-CM | POA: Diagnosis not present

## 2016-01-05 DIAGNOSIS — Z888 Allergy status to other drugs, medicaments and biological substances status: Secondary | ICD-10-CM

## 2016-01-05 DIAGNOSIS — K219 Gastro-esophageal reflux disease without esophagitis: Secondary | ICD-10-CM | POA: Diagnosis present

## 2016-01-05 DIAGNOSIS — Z8249 Family history of ischemic heart disease and other diseases of the circulatory system: Secondary | ICD-10-CM

## 2016-01-05 DIAGNOSIS — J9602 Acute respiratory failure with hypercapnia: Secondary | ICD-10-CM | POA: Diagnosis present

## 2016-01-05 DIAGNOSIS — Z85028 Personal history of other malignant neoplasm of stomach: Secondary | ICD-10-CM

## 2016-01-05 DIAGNOSIS — R400 Somnolence: Secondary | ICD-10-CM | POA: Diagnosis not present

## 2016-01-05 DIAGNOSIS — E039 Hypothyroidism, unspecified: Secondary | ICD-10-CM | POA: Diagnosis present

## 2016-01-05 DIAGNOSIS — R000 Tachycardia, unspecified: Secondary | ICD-10-CM | POA: Diagnosis present

## 2016-01-05 DIAGNOSIS — Z923 Personal history of irradiation: Secondary | ICD-10-CM

## 2016-01-05 DIAGNOSIS — E785 Hyperlipidemia, unspecified: Secondary | ICD-10-CM | POA: Diagnosis present

## 2016-01-05 DIAGNOSIS — A419 Sepsis, unspecified organism: Secondary | ICD-10-CM

## 2016-01-05 DIAGNOSIS — Z515 Encounter for palliative care: Secondary | ICD-10-CM | POA: Diagnosis present

## 2016-01-05 DIAGNOSIS — Z9221 Personal history of antineoplastic chemotherapy: Secondary | ICD-10-CM

## 2016-01-05 DIAGNOSIS — R0902 Hypoxemia: Secondary | ICD-10-CM | POA: Diagnosis not present

## 2016-01-05 DIAGNOSIS — R109 Unspecified abdominal pain: Secondary | ICD-10-CM

## 2016-01-05 DIAGNOSIS — Z8501 Personal history of malignant neoplasm of esophagus: Secondary | ICD-10-CM

## 2016-01-05 DIAGNOSIS — Z66 Do not resuscitate: Secondary | ICD-10-CM | POA: Diagnosis present

## 2016-01-05 LAB — COMPREHENSIVE METABOLIC PANEL
ALK PHOS: 58 U/L (ref 38–126)
ALT: 13 U/L — AB (ref 17–63)
AST: 47 U/L — AB (ref 15–41)
Albumin: 4 g/dL (ref 3.5–5.0)
Anion gap: 11 (ref 5–15)
BILIRUBIN TOTAL: 1.4 mg/dL — AB (ref 0.3–1.2)
BUN: 13 mg/dL (ref 6–20)
CO2: 27 mmol/L (ref 22–32)
CREATININE: 0.72 mg/dL (ref 0.61–1.24)
Calcium: 9.8 mg/dL (ref 8.9–10.3)
Chloride: 105 mmol/L (ref 101–111)
GFR calc Af Amer: >60 mL/min (ref >60)
Glucose, Bld: 78 mg/dL (ref 65–99)
Potassium: 4.6 mmol/L (ref 3.5–5.1)
Sodium: 143 mmol/L (ref 135–145)
TOTAL PROTEIN: 7.1 g/dL (ref 6.5–8.1)

## 2016-01-05 LAB — URINALYSIS COMPLETE WITH MICROSCOPIC (ARMC ONLY)
Bacteria, UA: NONE SEEN
Bilirubin Urine: NEGATIVE
GLUCOSE, UA: NEGATIVE mg/dL
Hgb urine dipstick: NEGATIVE
KETONES UR: NEGATIVE mg/dL
Nitrite: NEGATIVE
Protein, ur: 30 mg/dL — AB
SPECIFIC GRAVITY, URINE: 1.014 (ref 1.005–1.030)
SQUAMOUS EPITHELIAL / LPF: NONE SEEN
pH: 5 (ref 5.0–8.0)

## 2016-01-05 LAB — CBC
HEMATOCRIT: 32.6 % — AB (ref 40.0–52.0)
Hemoglobin: 10.2 g/dL — ABNORMAL LOW (ref 13.0–18.0)
MCH: 24.7 pg — ABNORMAL LOW (ref 26.0–34.0)
MCHC: 31.4 g/dL — AB (ref 32.0–36.0)
MCV: 78.7 fL — AB (ref 80.0–100.0)
PLATELETS: 92 10*3/uL — AB (ref 150–440)
RBC: 4.14 MIL/uL — ABNORMAL LOW (ref 4.40–5.90)
RDW: 21.5 % — AB (ref 11.5–14.5)
WBC: 1.6 10*3/uL — ABNORMAL LOW (ref 3.8–10.6)

## 2016-01-05 LAB — LACTIC ACID, PLASMA
LACTIC ACID, VENOUS: 2.7 mmol/L — AB (ref 0.5–1.9)
Lactic Acid, Venous: 3.8 mmol/L (ref 0.5–1.9)

## 2016-01-05 LAB — LIPASE, BLOOD: Lipase: 22 U/L (ref 11–51)

## 2016-01-05 LAB — GLUCOSE, CAPILLARY: Glucose-Capillary: 94 mg/dL (ref 65–99)

## 2016-01-05 LAB — TROPONIN I

## 2016-01-05 MED ORDER — VANCOMYCIN HCL 10 G IV SOLR
1250.0000 mg | Freq: Two times a day (BID) | INTRAVENOUS | Status: DC
  Administered 2016-01-06 – 2016-01-07 (×3): 1250 mg via INTRAVENOUS
  Filled 2016-01-05 (×6): qty 1250

## 2016-01-05 MED ORDER — PIPERACILLIN-TAZOBACTAM 3.375 G IVPB 30 MIN
3.3750 g | Freq: Once | INTRAVENOUS | Status: AC
  Administered 2016-01-05: 3.375 g via INTRAVENOUS
  Filled 2016-01-05: qty 50

## 2016-01-05 MED ORDER — ONDANSETRON HCL 4 MG PO TABS: 4.0000 mg | ORAL_TABLET | Freq: Four times a day (QID) | ORAL | Status: DC | PRN

## 2016-01-05 MED ORDER — ONDANSETRON HCL 4 MG/2ML IJ SOLN
4.0000 mg | Freq: Once | INTRAMUSCULAR | Status: AC
  Administered 2016-01-05: 4 mg via INTRAVENOUS
  Filled 2016-01-05: qty 2

## 2016-01-05 MED ORDER — PIPERACILLIN-TAZOBACTAM 3.375 G IVPB
3.3750 g | Freq: Three times a day (TID) | INTRAVENOUS | Status: DC
  Administered 2016-01-06 – 2016-01-07 (×5): 3.375 g via INTRAVENOUS
  Filled 2016-01-05 (×8): qty 50

## 2016-01-05 MED ORDER — METFORMIN HCL 500 MG PO TABS
1000.0000 mg | ORAL_TABLET | Freq: Two times a day (BID) | ORAL | Status: DC
  Administered 2016-01-06: 08:00:00 1000 mg via ORAL
  Filled 2016-01-05: qty 2

## 2016-01-05 MED ORDER — PANTOPRAZOLE SODIUM 40 MG PO TBEC
40.0000 mg | DELAYED_RELEASE_TABLET | Freq: Two times a day (BID) | ORAL | Status: DC
  Administered 2016-01-05 – 2016-01-07 (×5): 40 mg via ORAL
  Filled 2016-01-05 (×4): qty 1

## 2016-01-05 MED ORDER — SODIUM CHLORIDE 0.9 % IV BOLUS (SEPSIS)
1000.0000 mL | Freq: Once | INTRAVENOUS | Status: AC
  Administered 2016-01-05: 1000 mL via INTRAVENOUS

## 2016-01-05 MED ORDER — SODIUM CHLORIDE 0.9 % IV SOLN
INTRAVENOUS | Status: DC
  Administered 2016-01-05: 23:00:00 via INTRAVENOUS

## 2016-01-05 MED ORDER — LEVOTHYROXINE SODIUM 100 MCG PO TABS
100.0000 ug | ORAL_TABLET | Freq: Every day | ORAL | Status: DC
  Administered 2016-01-06 – 2016-01-07 (×2): 100 ug via ORAL
  Filled 2016-01-05 (×2): qty 1

## 2016-01-05 MED ORDER — SPIRONOLACTONE 25 MG PO TABS
50.0000 mg | ORAL_TABLET | Freq: Every day | ORAL | Status: DC
  Administered 2016-01-06 – 2016-01-07 (×2): 50 mg via ORAL
  Filled 2016-01-05 (×2): qty 2

## 2016-01-05 MED ORDER — GLIPIZIDE ER 10 MG PO TB24
10.0000 mg | ORAL_TABLET | Freq: Every day | ORAL | Status: DC
  Administered 2016-01-06: 08:00:00 10 mg via ORAL
  Filled 2016-01-05: qty 1

## 2016-01-05 MED ORDER — ONDANSETRON HCL 4 MG/2ML IJ SOLN: 4.0000 mg | Freq: Four times a day (QID) | INTRAMUSCULAR | Status: DC | PRN

## 2016-01-05 MED ORDER — VANCOMYCIN HCL IN DEXTROSE 1-5 GM/200ML-% IV SOLN
1000.0000 mg | Freq: Once | INTRAVENOUS | Status: AC
  Administered 2016-01-05: 1000 mg via INTRAVENOUS
  Filled 2016-01-05: qty 200

## 2016-01-05 MED ORDER — SIMVASTATIN 20 MG PO TABS
40.0000 mg | ORAL_TABLET | Freq: Every day | ORAL | Status: DC
  Administered 2016-01-06: 40 mg via ORAL
  Filled 2016-01-05: qty 2

## 2016-01-05 MED ORDER — NADOLOL 20 MG PO TABS
10.0000 mg | ORAL_TABLET | Freq: Every evening | ORAL | Status: DC
  Administered 2016-01-06: 10 mg via ORAL
  Filled 2016-01-05 (×2): qty 1

## 2016-01-05 MED ORDER — ALLOPURINOL 300 MG PO TABS
300.0000 mg | ORAL_TABLET | Freq: Every day | ORAL | Status: DC
Start: 2016-01-05 — End: 2016-01-07
  Administered 2016-01-06 – 2016-01-07 (×2): 300 mg via ORAL
  Filled 2016-01-05: qty 1
  Filled 2016-01-05: qty 3

## 2016-01-05 NOTE — ED Notes (Signed)
Pt O2 saturation decreased to 86% placed on 2L via nasal canula.

## 2016-01-05 NOTE — ED Notes (Signed)
Pt had witnessed episode of vomiting. Vomit was yellow and large.

## 2016-01-05 NOTE — ED Triage Notes (Signed)
Pt arrived via ems with complaints of n/v since this morning.

## 2016-01-05 NOTE — ED Notes (Signed)
Patient transported to X-ray 

## 2016-01-05 NOTE — ED Provider Notes (Signed)
[facility] Emergency Department Provider Note  Time seen: 5:40 PM  I have reviewed the triage vital signs and the nursing notes.   HISTORY  Chief Complaint Nausea    HPI Mark Ford is a 70 y.o. male with a past medical history of gastric cancer, cirrhosis, diabetes, esophageal varices, gastric reflux, heart failure, hyperlipidemia, who presents the emergency department with nausea and vomiting as well as cough. According to the patient and the wife for the past one week he has had a significant cough, today he has been feeling very nauseated, had an episode of vomiting so they brought him to the emergency department for evaluation. Upon arrival to the emergency department the patient is hypoxic 87-88 percent on room air. Patient states mild abdominal discomfort which is chronic, denies any acute worsening. Denies any chest pain. States he did have a brief episode of chest pain yesterday while reaching down for something. Denies fever but states he was feeling chills today with his teeth rattling.  Past Medical History:  Diagnosis Date  . CAD (coronary artery disease)   . Cancer [facility])    adenocarcinoma fo the GE Junction  . 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 Diabetes mellitus without complication ([place])   . Esophageal varices ([place])   . GERD (gastroesophageal reflux disease)   . Heart failure ([place])   . Hyperlipidemia   . S/P CABG (coronary artery bypass graft)   . Thyroid disease    hypothroidism    Patient Active Problem List   Diagnosis Date Noted  . Arthritis 07/28/2015  . Chicken pox 07/28/2015  . Arteriosclerosis of coronary artery 07/28/2015  . Gout 07/28/2015  . GI bleed 06/22/2015  . Esophageal cancer ([place]) 11/11/2014    Past Surgical History:  Procedure Laterality Date  . CORONARY ARTERY BYPASS GRAFT    . ESOPHAGOGASTRODUODENOSCOPY (EGD) WITH PROPOFOL N/A 02/11/2015   Procedure: ESOPHAGOGASTRODUODENOSCOPY (EGD) WITH PROPOFOL;  Surgeon:  Manya Silvas, MD;  Location[facility] ENDOSCOPY;  Service: Endoscopy;  Laterality: N/A;  . ESOPHAGOGASTRODUODENOSCOPY (EGD) WITH PROPOFOL N/A 06/23/2015   Procedure: ESOPHAGOGASTRODUODENOSCOPY (EGD) WITH PROPOFOL;  Surgeon: Josefine Class, MD;  Location: [facility] ENDOSCOPY;  Service: Endoscopy;  Laterality: N/A;  . ESOPHAGOGASTRODUODENOSCOPY (EGD) WITH PROPOFOL N/A 07/26/2015   Procedure: ESOPHAGOGASTRODUODENOSCOPY (EGD) WITH PROPOFOL;  Surgeon: Josefine Class, MD;  Location: [facility] ENDOSCOPY;  Service: Endoscopy;  Laterality: N/A;  . EUS N/A 10/07/2015   Procedure: UPPER ENDOSCOPIC ULTRASOUND (EUS) LINEAR;  Surgeon: Cora Daniels, MD;  Location: [facility] ENDOSCOPY;  Service: Endoscopy;  Laterality: N/A;    Prior to Admission medications   Medication Sig Start Date End Date Taking? Authorizing Provider  allopurinol (ZYLOPRIM) 300 MG tablet Take 300 mg by mouth daily. Take in the morning    Historical Provider, MD  glipiZIDE (GLUCOTROL XL) 10 MG 24 hr tablet Take 10 mg by mouth daily with breakfast.    Historical Provider, MD  levothyroxine (SYNTHROID, LEVOTHROID) 100 MCG tablet Take 100 mcg by mouth daily before breakfast.  08/24/14   Historical Provider, MD  metFORMIN (GLUCOPHAGE) 1000 MG tablet Take 1,000 mg by mouth 2 (two) times daily with a meal.    Historical Provider, MD  nadolol (CORGARD) 20 MG tablet 1/2 tablet orally every evening 06/25/15   Loletha Grayer, MD  pantoprazole (PROTONIX) 40 MG tablet Take 1 tablet (40 mg total) by mouth 2 (two) times daily. 06/25/15   Loletha Grayer, MD  simvastatin (ZOCOR) 40 MG tablet Take by mouth. 07/14/15  Historical Provider, MD  spironolactone (ALDACTONE) 50 MG tablet Take 1 tablet by mouth daily. 07/06/15 07/05/16  Historical Provider, MD    Allergies  Allergen Reactions  . Shellfish Allergy Other (See Comments)    GOUT  . Tylenol [Acetaminophen] Other (See Comments)    Not allergic, but was told not to take it any more due to his liver  condition.     Family History  Problem Relation Age of Onset  . Hypertension      Social History Social History  Substance Use Topics  . Smoking status: Never Smoker  . Smokeless tobacco: Never Used  . Alcohol use No    Review of Systems Constitutional: Subjective fever/chills. Cardiovascular: Chest pain yesterday, none today. Respiratory: Positive for shortness breath. Positive for cough. Gastrointestinal: Chronic abdominal discomfort, denies any acute worsening. Positive for nausea and vomiting today. Genitourinary: Negative for dysuria. Musculoskeletal: Negative for back pain. Neurological: Negative for headaches, focal weakness or numbness. 10-point ROS otherwise negative.  ____________________________________________   PHYSICAL EXAM:  VITAL SIGNS: ED Triage Vitals [12/24/2015 1654]  Enc Vitals Group     BP (!) 112/47     Pulse Rate 77     Resp (!) 25     Temp 99.3 F (37.4 C)     Temp Source Oral     SpO2 93 %     Weight 205 lb (93 kg)     Height 5\' 6"  (1.676 m)     Head Circumference      [place] Flow      Pain Score      Pain Loc      Pain Edu?      Excl. in [place]?     Constitutional: Alert and oriented. Well appearing and in no distress. Eyes: Normal exam ENT   Head: Normocephalic and atraumatic   Mouth/Throat: Mucous membranes are moist. Cardiovascular: Normal rate, regular rhythm. No murmur Respiratory: Moderate tachypnea around 25 breaths per minute, mild expiratory wheeze bilaterally. No rales or rhonchi. Frequent cough during examination. Gastrointestinal: Soft, slight/minimal abdominal tenderness to palpation diffusely. No rebound or guarding. No distention. Musculoskeletal: Nontender with normal range of motion in all extremities.  Neurologic:  Normal speech and language. No gross focal neurologic deficits Skin:  Skin is warm, dry and intact.  Psychiatric: Mood and affect are normal.   ____________________________________________     EKG  EKG reviewed and interpreted by myself shows normal sinus rhythm at 79 bpm, borderline widened QRS, left axis deviation, RSR pattern consistent with right bundle-branch block. Nonspecific ST changes without ST elevation.  ____________________________________________    RADIOLOGY  Chest x-ray shows left lower lobe pneumonia  ____________________________________________   INITIAL IMPRESSION / ASSESSMENT AND PLAN / ED COURSE  Pertinent labs & imaging results that were available during my care of the patient were reviewed by me and considered in my medical decision making (see chart for details).  The patient presents the emergency department with nausea vomiting shortness of breath, and cough. Vital show hypoxia 88% on room air, no oxygen requirement at baseline. Mild expiratory wheeze, frequent cough during examination. Patient's blood pressure 90s over 60s currently. I have activated code sepsis protocols. We will check labs, chest x-ray, start empiric antibiotics and closely monitor in the emergency department.  Labs show a very low white blood cell count of 1.6. Troponin is negative. Chemistry largely at baseline. Currently awaiting lactate results. Chest x-ray consistent with left lower lobe pneumonia. Patient receiving IV antibiotics for pneumonia leading to  sepsis. We will admit to loss of further treatment.  CRITICAL CARE Performed by: Harvest Dark   Total critical care time: 30 minutes  Critical care time was exclusive of separately billable procedures and treating other patients.  Critical care was necessary to treat or prevent imminent or life-threatening deterioration.  Critical care was time spent personally by me on the following activities: development of treatment plan with patient and/or surrogate as well as nursing, discussions with consultants, evaluation of patient's response to treatment, examination of patient, obtaining history from patient or  surrogate, ordering and performing treatments and interventions, ordering and review of laboratory studies, ordering and review of radiographic studies, pulse oximetry and re-evaluation of patient's condition.   ____________________________________________   FINAL CLINICAL IMPRESSION(S) / ED DIAGNOSES  Sepsis Pneumonia    Harvest Dark, MD 01/03/2016 [phone]

## 2016-01-05 NOTE — H&P (Signed)
[facility] [place] NAME: Mark Ford    MR#:  [id number]  DATE OF BIRTH:  September 29, 1945  DATE OF ADMISSION:  12/30/2015  PRIMARY CARE PHYSICIAN: Maryland Pink, MD   REQUESTING/REFERRING PHYSICIAN: Dr. Harvest Dark  CHIEF COMPLAINT:   Chief Complaint  Patient presents with  . Nausea    HISTORY OF PRESENT ILLNESS:  Mark Ford  is a 70 y.o. male with a known history of Esophageal cancer, cryptogenic cirrhosis, coronary artery disease, pancytopenia, esophageal varices, hyperlipidemia, congestive heart failure, history of coronary bypass graft surgery, hypothyroidism who presents to the hospital chart nausea tremors and some abdominal pain. As per the wife gives most of the history patient has been complaining of some vague abdominal pain this afternoon which she thought was related to some gas. The wife gave him some Gas-X but that did not improve his symptoms. He started to have some tremors and shakes and therefore she decided to bring him to the ER for further evaluation.  She was driving to the ER when her husband continue to have significant shakes and became short of breath, she then waved a cop over and who then told her to pull over and called a ambulance for them and he was brought to the hospital. In the ER on workup patient was noted to have a pneumonia and hospitalist services were contacted further treatment and evaluation.  PAST MEDICAL HISTORY:   Past Medical History:  Diagnosis Date  . CAD (coronary artery disease)   . Cancer [facility])    adenocarcinoma fo the [place]  . Cirrhosis, cryptogenic ([place])   . Diabetes mellitus without complication ([place])   . Esophageal varices ([place])   . GERD (gastroesophageal reflux disease)   . Heart failure ([place])   . Hyperlipidemia   . S/P CABG (coronary artery bypass graft)   . Thyroid disease    hypothroidism    PAST SURGICAL HISTORY:   Past Surgical History:  Procedure Laterality  Date  . CORONARY ARTERY BYPASS GRAFT    . ESOPHAGOGASTRODUODENOSCOPY (EGD) WITH PROPOFOL N/A 02/11/2015   Procedure: ESOPHAGOGASTRODUODENOSCOPY (EGD) WITH PROPOFOL;  Surgeon: Manya Silvas, MD;  Location: [facility] ENDOSCOPY;  Service: Endoscopy;  Laterality: N/A;  . ESOPHAGOGASTRODUODENOSCOPY (EGD) WITH PROPOFOL N/A 06/23/2015   Procedure: ESOPHAGOGASTRODUODENOSCOPY (EGD) WITH PROPOFOL;  Surgeon: Josefine Class, MD;  Location: [facility] ENDOSCOPY;  Service: Endoscopy;  Laterality: N/A;  . ESOPHAGOGASTRODUODENOSCOPY (EGD) WITH PROPOFOL N/A 07/26/2015   Procedure: ESOPHAGOGASTRODUODENOSCOPY (EGD) WITH PROPOFOL;  Surgeon: Josefine Class, MD;  Location: [facility] ENDOSCOPY;  Service: Endoscopy;  Laterality: N/A;  . EUS N/A 10/07/2015   Procedure: UPPER ENDOSCOPIC ULTRASOUND (EUS) LINEAR;  Surgeon: Cora Daniels, MD;  Location: [facility] ENDOSCOPY;  Service: Endoscopy;  Laterality: N/A;    SOCIAL HISTORY:   Social History  Substance Use Topics  . Smoking status: Never Smoker  . Smokeless tobacco: Never Used  . Alcohol use No    FAMILY HISTORY:   Family History  Problem Relation Age of Onset  . Hypertension      DRUG ALLERGIES:   Allergies  Allergen Reactions  . Shellfish Allergy Other (See Comments)    GOUT  . Tylenol [Acetaminophen] Other (See Comments)    Not allergic, but was told not to take it any more due to his liver condition.     REVIEW OF SYSTEMS:   Review of Systems  Constitutional: Negative for fever and weight loss.  HENT: Negative for congestion, nosebleeds and  tinnitus.   Eyes: Negative for blurred vision, double vision and redness.  Respiratory: Positive for shortness of breath. Negative for cough and hemoptysis.   Cardiovascular: Negative for chest pain, orthopnea, leg swelling and PND.  Gastrointestinal: Positive for nausea and vomiting. Negative for abdominal pain, diarrhea and melena.  Genitourinary: Negative for dysuria, hematuria and urgency.   Musculoskeletal: Negative for falls and joint pain.  Neurological: Negative for dizziness, tingling, sensory change, focal weakness, seizures, weakness and headaches.  Endo/Heme/Allergies: Negative for polydipsia. Does not bruise/bleed easily.  Psychiatric/Behavioral: Negative for depression and memory loss. The patient is not nervous/anxious.     MEDICATIONS AT HOME:   Prior to Admission medications   Medication Sig Start Date End Date Taking? Authorizing Provider  allopurinol (ZYLOPRIM) 300 MG tablet Take 300 mg by mouth daily. Take in the morning   Yes Historical Provider, MD  glipiZIDE (GLUCOTROL XL) 10 MG 24 hr tablet Take 10 mg by mouth daily with breakfast.   Yes Historical Provider, MD  levothyroxine (SYNTHROID, LEVOTHROID) 100 MCG tablet Take 100 mcg by mouth daily before breakfast.  08/24/14  Yes Historical Provider, MD  metFORMIN (GLUCOPHAGE) 1000 MG tablet Take 1,000 mg by mouth 2 (two) times daily with a meal.   Yes Historical Provider, MD  nadolol (CORGARD) 20 MG tablet 1/2 tablet orally every evening 06/25/15  Yes Loletha Grayer, MD  pantoprazole (PROTONIX) 40 MG tablet Take 1 tablet (40 mg total) by mouth 2 (two) times daily. 06/25/15  Yes Loletha Grayer, MD  simvastatin (ZOCOR) 40 MG tablet Take 40 mg by mouth daily at 6 PM.  07/14/15  Yes Historical Provider, MD  spironolactone (ALDACTONE) 50 MG tablet Take 1 tablet by mouth daily. 07/06/15 07/05/16 Yes Historical Provider, MD      VITAL SIGNS:  Blood pressure (!) 111/92, pulse 76, temperature 99.3 F (37.4 C), temperature source Oral, resp. rate (!) 28, height 5' 6"  (1.676 m), weight 93 kg (205 lb), SpO2 100 %.  PHYSICAL EXAMINATION:  Physical Exam  GENERAL:  70 y.o.-year-old patient lying in bed in no acute distress.  EYES: Pupils equal, round, reactive to light and accommodation. No scleral icterus. Extraocular muscles intact.  HEENT: Head atraumatic, normocephalic. Oropharynx and nasopharynx clear. No oropharyngeal  erythema, moist oral mucosa  NECK:  Supple, no jugular venous distention. No thyroid enlargement, no tenderness.  LUNGS: Normal breath sounds bilaterally, no wheezing, rales, rhonchi. No use of accessory muscles of respiration.  CARDIOVASCULAR: S1, S2 RRR. II/VI SEM at LSB, No rubs, gallops, clicks.  ABDOMEN: Soft, nontender, nondistended. Bowel sounds present. No organomegaly or mass.  EXTREMITIES: No pedal edema, cyanosis, or clubbing. + 2 pedal & radial pulses b/l.   NEUROLOGIC: Cranial nerves II through XII are intact. No focal Motor or sensory deficits appreciated b/l. Globally weak.  PSYCHIATRIC: The patient is alert and oriented x 3. Good affect.  SKIN: No obvious rash, lesion, or ulcer.   LABORATORY PANEL:   CBC  Recent Labs Lab 12/31/2015 1743  WBC 1.6*  HGB 10.2*  HCT 32.6*  PLT 92*   ------------------------------------------------------------------------------------------------------------------  Chemistries   Recent Labs Lab 01/02/2016 1713  NA 143  K 4.6  CL 105  CO2 27  GLUCOSE 78  BUN 13  CREATININE 0.72  CALCIUM 9.8  AST 47*  ALT 13*  ALKPHOS 58  BILITOT 1.4*   ------------------------------------------------------------------------------------------------------------------  Cardiac Enzymes  Recent Labs Lab 12/21/2015 1713  TROPONINI <0.03   ------------------------------------------------------------------------------------------------------------------  RADIOLOGY:  Dg Chest 2 View  Result Date:  12/15/2015 CLINICAL DATA:  Nausea and vomiting beginning today. Hypoxia and weakness. Shortness of breath. EXAM: CHEST  2 VIEW COMPARISON:  Two-view chest x-ray 07/13/2015. FINDINGS: The heart is enlarged. A right IJ Port-A-Cath is stable. The tip is at the cavoatrial junction. Lung volumes are low. Median sternotomy for CABG is noted. Left lower lobe pneumonia is present. A left pleural effusion is noted. IMPRESSION: 1. New left lower lobe pneumonia and  pleural effusion. 2. Low lung volumes. 3. Stable right IJ Port-A-Cath. Electronically Signed   By: San Morelle M.D.   On: 12/13/2015 18:28    IMPRESSION AND PLAN:   70 year old male with past medical history of esophageal cancer, cryptogenic liver cirrhosis, history of esophageal varices, pancytopenia, coronary artery disease, congestive heart failure presented to the hospital due to nausea, vomiting, vague abdominal pain but noted to be hypoxic and noted to have a pneumonia.  1. Sepsis-patient met criteria given his leukopenia, tachypnea and chest x-ray findings suggestive of a left lower lobe pneumonia. -Patient will keep with IV vancomycin and Zosyn for his pneumonia. Follow fever curve, hemodynamics.  2. Pneumonia-the cause of patient's sepsis. -Treat the patient with IV vancomycin, Zosyn. Follow sputum cultures, follow blood cultures.  3. Pancytopenia-etiology unclear. Patient does have a history of Cirrhosis which could be the cause of this. -He likely needs a bone marrow biopsy which has been discussed with him by his oncologist, but he refuses at this time. -Follow serial counts.  4. History of esophageal varices-no acute bleeding. Continue Nadolol  5. Diabetes-continue metformin, glipizide.  6. Hypothyroidism-continue Synthroid.  7. GERD-continue Protonix.    All the records are reviewed and case discussed with ED provider. Management plans discussed with the patient, family and they are in agreement.  CODE STATUS: Full  TOTAL TIME TAKING CARE OF THIS PATIENT: 45 minutes.    Henreitta Leber M.D on 12/30/2015 at 7:13 PM  Between 7am to 6pm - Pager - [phone]  After 6pm go to [email or web address] - password [email or web address] [facility]  [facility]  Office  [phone]  CC: Primary care physician; Maryland Pink, MD

## 2016-01-05 NOTE — Progress Notes (Addendum)
Pharmacy Antibiotic Note  Mark Ford is a 70 y.o. male admitted on 01/01/2016 with sepsis.  Pharmacy has been consulted for vancomycin and piperacillin/tazobactam dosing.  Plan:  Patient received one dose vancomycin 1000 mg IV and one dose piperacillin/tazobactam 3.375 mg IV in the ED. Patient will be continued on vancomycin 1250 mg IV every 12 hours and piperacillin/tazobactam 3.375 mg (over 4 hours) every 8 hours. Trough to be drawn prior to 5th vanc dose (07/28 1330) with a goal trough 15-20 mcg/mL.  AdjBW: 72.78 kg CrCl based on adjBW: 83.76mL/min Ke: 0.074 T1/2: 9.36 Vd: 59.5  Height: 5\' 6"  (167.6 cm) Weight: 205 lb (93 kg) IBW/kg (Calculated) : 63.8  Temp (24hrs), Avg:99.3 F (37.4 C), Min:99.3 F (37.4 C), Max:99.3 F (37.4 C)   Recent Labs Lab 01/09/2016 1713 01/06/2016 1743  WBC  --  1.6*  CREATININE 0.72  --   LATICACIDVEN  --  3.8*    Allergies  Allergen Reactions  . Shellfish Allergy Other (See Comments)    GOUT  . Tylenol [Acetaminophen] Other (See Comments)    Not allergic, but was told not to take it any more due to his liver condition.     Antimicrobials this admission: Piperacillin/tazobactam 07/26 >> Vancomycin 07/26 >>   Microbiology results: 07/26 BCx: Sent 07/26 UCx: Sent   Thank you for allowing pharmacy to be a part of this patient's care.  Darrow Bussing, PharmD Pharmacy Resident 12/22/2015 8:14 PM

## 2016-01-06 ENCOUNTER — Inpatient Hospital Stay: Payer: Medicare Other

## 2016-01-06 DIAGNOSIS — K7469 Other cirrhosis of liver: Secondary | ICD-10-CM

## 2016-01-06 DIAGNOSIS — R0902 Hypoxemia: Secondary | ICD-10-CM

## 2016-01-06 DIAGNOSIS — R400 Somnolence: Secondary | ICD-10-CM

## 2016-01-06 DIAGNOSIS — J189 Pneumonia, unspecified organism: Secondary | ICD-10-CM

## 2016-01-06 LAB — COMPREHENSIVE METABOLIC PANEL
ALT: 12 U/L — ABNORMAL LOW (ref 17–63)
ANION GAP: 9 (ref 5–15)
AST: 30 U/L (ref 15–41)
Albumin: 3.4 g/dL — ABNORMAL LOW (ref 3.5–5.0)
Alkaline Phosphatase: 39 U/L (ref 38–126)
BILIRUBIN TOTAL: 1.9 mg/dL — AB (ref 0.3–1.2)
BUN: 32 mg/dL — AB (ref 6–20)
CHLORIDE: 105 mmol/L (ref 101–111)
CO2: 25 mmol/L (ref 22–32)
Calcium: 8.1 mg/dL — ABNORMAL LOW (ref 8.9–10.3)
Creatinine, Ser: 2.55 mg/dL — ABNORMAL HIGH (ref 0.61–1.24)
GFR, EST AFRICAN AMERICAN: 28 mL/min — AB (ref >60)
GFR, EST NON AFRICAN AMERICAN: 24 mL/min — AB (ref >60)
Glucose, Bld: 119 mg/dL — ABNORMAL HIGH (ref 65–99)
POTASSIUM: 4.8 mmol/L (ref 3.5–5.1)
Sodium: 139 mmol/L (ref 135–145)
TOTAL PROTEIN: 6.7 g/dL (ref 6.5–8.1)

## 2016-01-06 LAB — CBC
HCT: 28.4 % — ABNORMAL LOW (ref 40.0–52.0)
HEMATOCRIT: 30.3 % — AB (ref 40.0–52.0)
Hemoglobin: 8.7 g/dL — ABNORMAL LOW (ref 13.0–18.0)
Hemoglobin: 9.1 g/dL — ABNORMAL LOW (ref 13.0–18.0)
MCH: 24 pg — AB (ref 26.0–34.0)
MCH: 24.4 pg — AB (ref 26.0–34.0)
MCHC: 30.7 g/dL — AB (ref 32.0–36.0)
MCHC: 29.9 g/dL — AB (ref 32.0–36.0)
MCV: 78 fL — AB (ref 80.0–100.0)
MCV: 81.4 fL (ref 80.0–100.0)
PLATELETS: 61 10*3/uL — AB (ref 150–440)
PLATELETS: 71 10*3/uL — AB (ref 150–440)
RBC: 3.65 MIL/uL — ABNORMAL LOW (ref 4.40–5.90)
RBC: 3.72 MIL/uL — ABNORMAL LOW (ref 4.40–5.90)
RDW: 21.2 % — AB (ref 11.5–14.5)
RDW: 22.5 % — AB (ref 11.5–14.5)
WBC: 10.4 10*3/uL (ref 3.8–10.6)
WBC: 2.6 10*3/uL — ABNORMAL LOW (ref 3.8–10.6)

## 2016-01-06 LAB — BASIC METABOLIC PANEL
Anion gap: 8 (ref 5–15)
BUN: 16 mg/dL (ref 6–20)
CHLORIDE: 109 mmol/L (ref 101–111)
CO2: 26 mmol/L (ref 22–32)
CREATININE: 1.02 mg/dL (ref 0.61–1.24)
Calcium: 8.6 mg/dL — ABNORMAL LOW (ref 8.9–10.3)
GFR calc Af Amer: >60 mL/min (ref >60)
GFR calc non Af Amer: >60 mL/min (ref >60)
Glucose, Bld: 87 mg/dL (ref 65–99)
Potassium: 3.7 mmol/L (ref 3.5–5.1)
Sodium: 143 mmol/L (ref 135–145)

## 2016-01-06 LAB — LACTIC ACID, PLASMA
Lactic Acid, Venous: 2.1 mmol/L (ref 0.5–1.9)
Lactic Acid, Venous: 3.1 mmol/L (ref 0.5–1.9)
Lactic Acid, Venous: 4 mmol/L (ref 0.5–1.9)

## 2016-01-06 LAB — AMMONIA: Ammonia: 37 umol/L — ABNORMAL HIGH (ref 9–35)

## 2016-01-06 LAB — AMYLASE: AMYLASE: 28 U/L (ref 28–100)

## 2016-01-06 LAB — GLUCOSE, CAPILLARY
GLUCOSE-CAPILLARY: 74 mg/dL (ref 65–99)
GLUCOSE-CAPILLARY: 104 mg/dL — AB (ref 65–99)
Glucose-Capillary: 75 mg/dL (ref 65–99)

## 2016-01-06 LAB — LIPASE, BLOOD: LIPASE: 13 U/L (ref 11–51)

## 2016-01-06 MED ORDER — SODIUM CHLORIDE 0.9 % IV SOLN
INTRAVENOUS | Status: DC
  Administered 2016-01-06: 01:00:00 via INTRAVENOUS

## 2016-01-06 MED ORDER — IPRATROPIUM-ALBUTEROL 0.5-2.5 (3) MG/3ML IN SOLN
3.0000 mL | Freq: Four times a day (QID) | RESPIRATORY_TRACT | Status: DC
  Administered 2016-01-06 – 2016-01-07 (×6): 3 mL via RESPIRATORY_TRACT
  Filled 2016-01-06 (×7): qty 3

## 2016-01-06 MED ORDER — SODIUM CHLORIDE 0.9 % IV BOLUS (SEPSIS)
500.0000 mL | Freq: Once | INTRAVENOUS | Status: AC
  Administered 2016-01-06: 500 mL via INTRAVENOUS

## 2016-01-06 MED ORDER — SODIUM CHLORIDE 0.9 % IV BOLUS (SEPSIS)
250.0000 mL | Freq: Once | INTRAVENOUS | Status: AC
  Administered 2016-01-06: 250 mL via INTRAVENOUS

## 2016-01-06 MED ORDER — INSULIN ASPART 100 UNIT/ML ~~LOC~~ SOLN
0.0000 [IU] | Freq: Three times a day (TID) | SUBCUTANEOUS | Status: DC
  Administered 2016-01-07 (×2): 1 [IU] via SUBCUTANEOUS
  Filled 2016-01-06 (×2): qty 1

## 2016-01-06 MED ORDER — AZITHROMYCIN 250 MG PO TABS
250.0000 mg | ORAL_TABLET | Freq: Every day | ORAL | Status: DC
  Administered 2016-01-07: 250 mg via ORAL
  Filled 2016-01-06: qty 1

## 2016-01-06 MED ORDER — INSULIN ASPART 100 UNIT/ML ~~LOC~~ SOLN: 0.0000 [IU] | Freq: Every day | SUBCUTANEOUS | Status: DC

## 2016-01-06 MED ORDER — HYDROMORPHONE HCL 1 MG/ML IJ SOLN
0.5000 mg | INTRAMUSCULAR | Status: DC | PRN
  Administered 2016-01-06 – 2016-01-07 (×2): 0.5 mg via INTRAVENOUS
  Filled 2016-01-06 (×2): qty 1

## 2016-01-06 MED ORDER — AZITHROMYCIN 250 MG PO TABS
500.0000 mg | ORAL_TABLET | Freq: Every day | ORAL | Status: AC
  Administered 2016-01-06: 10:00:00 500 mg via ORAL
  Filled 2016-01-06: qty 2

## 2016-01-06 NOTE — Progress Notes (Signed)
Patient found to be hypoxic - oxygen sats in 60s. Patient placed upright in bed and placed on 3L-O2. Patient recovered within 2 minutes, sats now 95% on 3L-O2. RN made aware. RT called for breathing treatment. Patient in no distress at this time. Will continue to monitor. Madlyn Frankel, RN

## 2016-01-06 NOTE — Plan of Care (Signed)
Problem: Respiratory: Goal: Respiratory status will improve Outcome: Progressing MD notified due to critical lactic acid level. IV fluids initiated, cardiac monitoring along with continuous pulse ox ordered. Repeat lactic acid for 5 am.

## 2016-01-06 NOTE — Progress Notes (Signed)
PT Cancellation Note  Patient Details Name: ZYEN ACKER MRN: [id number] DOB: Oct 19, 1945   Cancelled Treatment:    Reason Eval/Treat Not Completed: Medical issues which prohibited therapy;Other (comment). Pt's chart reviewed and discussed with RN Vickii Chafe). After discussion with RN and upon PT's arrival, patient was "found to be hypoxic - oxygen sats in 60s. Patient placed upright in bed and placed on 3L-O2. Patient recovered within 2 minutes, sats now 95% on 3L-O2. RN made aware. RT called for breathing treatment.", per RN note. RT was in the process of starting treatment when PT arrived. PT will f/u at a later time and complete evaluation when pt is appropriate to participate in therapy.   Neoma Laming, PT, DPT  01/06/16, 2:06 PM [phone]

## 2016-01-06 NOTE — Care Management (Signed)
Admitted to [facility] with the diagnosis of pneumonia. Lives with wife, Shauna Hugh [phone]). Primary care physician is Dr. Kary Kos. "Hadn't seen him in a while." No home health. No skilled facility. No home oxygen. Uses no aides for ambulation. Takes care of all basic and instrumental activities of daily living himself, drives. Last fall was 3 weeks ago. "Cats got under my feet." Good appetite, Prescriptions are filled at [facility] on [address]. Wife will transport. Shelbie Ammons RN MSN CCM Care Management [phone]

## 2016-01-06 NOTE — Progress Notes (Addendum)
Patient ID: Mark Ford, male   DOB: 01-13-1946, 70 y.o.   MRN: [id number]  [employer] PROGRESS NOTE  Mark Ford [id number] DOB: Oct 30, 1945 DOA: 12/16/2015 PCP: Maryland Pink, MD  HPI/Subjective: Called by nursing staff too see patient because he bacame more lethargic and dropped his pulse ox into the 60's and now requiring oxygen.  Patient states he does not feel well and some shortness of breath.  Objective: Vitals:   01/06/16 0812 01/06/16 1417  BP: 103/61 (!) 123/53  Pulse: 76 80  Resp: 16 20  Temp: 97.6 F (36.4 C)     Filed Weights   12/30/2015 1654 12/20/2015 2030  Weight: 93 kg (205 lb) 93.1 kg (205 lb 4.8 oz)    ROS: Review of Systems  Constitutional: Negative for chills and fever.  Eyes: Negative for blurred vision.  Respiratory: Positive for shortness of breath. Negative for cough.   Cardiovascular: Negative for chest pain.  Gastrointestinal: Positive for nausea and vomiting. Negative for abdominal pain, constipation and diarrhea.  Genitourinary: Negative for dysuria.  Musculoskeletal: Negative for joint pain.  Neurological: Negative for dizziness and headaches.   Exam: Physical Exam  HENT:  Nose: No mucosal edema.  Mouth/Throat: No oropharyngeal exudate or posterior oropharyngeal edema.  Eyes: Conjunctivae, EOM and lids are normal. Pupils are equal, round, and reactive to light.  Neck: No JVD present. Carotid bruit is not present. No edema present. No thyroid mass and no thyromegaly present.  Cardiovascular: S1 normal and S2 normal.  Exam reveals no gallop.   No murmur heard. Pulses:      Dorsalis pedis pulses are 2+ on the right side, and 2+ on the left side.  Respiratory: No respiratory distress. He has decreased breath sounds in the left middle field and the left lower field. He has no wheezes. He has rhonchi in the left middle field and the left lower field. He has no rales.  GI: Soft. Bowel sounds are normal. He exhibits distension. There is  no tenderness.  Musculoskeletal:       Right ankle: He exhibits swelling.       Left ankle: He exhibits swelling.  Lymphadenopathy:    He has no cervical adenopathy.  Neurological: He is alert.  Skin: Skin is warm. No rash noted. Nails show no clubbing.  Psychiatric: He has a normal mood and affect.      Data Reviewed: Basic Metabolic Panel:  Recent Labs Lab 01/04/2016 1713 01/06/16 0009  NA 143 143  K 4.6 3.7  CL 105 109  CO2 27 26  GLUCOSE 78 87  BUN 13 16  CREATININE 0.72 1.02  CALCIUM 9.8 8.6*   Liver Function Tests:  Recent Labs Lab 01/06/2016 1713  AST 47*  ALT 13*  ALKPHOS 58  BILITOT 1.4*  PROT 7.1  ALBUMIN 4.0    Recent Labs Lab 12/28/2015 1713  LIPASE 22   CBC:  Recent Labs Lab 01/02/2016 1743 01/06/16 0009  WBC 1.6* 2.6*  HGB 10.2* 8.7*  HCT 32.6* 28.4*  MCV 78.7* 78.0*  PLT 92* 61*   CBG:  Recent Labs Lab 12/23/2015 2053  GLUCAP 94    Recent Results (from the past 240 hour(s))  Blood Culture (routine x 2)     Status: None (Preliminary result)   Collection Time: 01/09/2016  5:43 PM  Result Value Ref Range Status   Specimen Description BLOOD RIGHT ASSIST CONTROL  Final   Special Requests BOTTLES DRAWN AEROBIC AND ANAEROBIC [place]  Final  Culture NO GROWTH < 24 HOURS  Final   Report Status PENDING  Incomplete  Blood Culture (routine x 2)     Status: None (Preliminary result)   Collection Time: 12/29/2015  5:44 PM  Result Value Ref Range Status   Specimen Description BLOOD LEFT ASSIST CONTROL  Final   Special Requests BOTTLES DRAWN AEROBIC AND ANAEROBIC [place]  Final   Culture NO GROWTH < 24 HOURS  Final   Report Status PENDING  Incomplete     Studies: Dg Chest 2 View  Result Date: 12/13/2015 CLINICAL DATA:  Nausea and vomiting beginning today. Hypoxia and weakness. Shortness of breath. EXAM: CHEST  2 VIEW COMPARISON:  Two-view chest x-ray 07/13/2015. FINDINGS: The heart is enlarged. A right IJ Port-A-Cath is stable. The tip is at the  cavoatrial junction. Lung volumes are low. Median sternotomy for CABG is noted. Left lower lobe pneumonia is present. A left pleural effusion is noted. IMPRESSION: 1. New left lower lobe pneumonia and pleural effusion. 2. Low lung volumes. 3. Stable right IJ Port-A-Cath. Electronically Signed   By: San Morelle M.D.   On: 12/25/2015 18:28   Scheduled Meds: . allopurinol  300 mg Oral Daily  . [START ON 01/07/2016] azithromycin  250 mg Oral Daily  . glipiZIDE  10 mg Oral Q breakfast  . ipratropium-albuterol  3 mL Nebulization Q6H  . levothyroxine  100 mcg Oral QAC breakfast  . nadolol  10 mg Oral QPM  . pantoprazole  40 mg Oral BID  . piperacillin-tazobactam (ZOSYN)  IV  3.375 g Intravenous Q8H  . simvastatin  40 mg Oral q1800  . spironolactone  50 mg Oral Daily  . vancomycin  1,250 mg Intravenous Q12H    Assessment/Plan:  1. Acute hypoxic respiratory failure with pulse ox dropping into the 60s. Patient placed on nasal cannula. Nebulizer treatments ordered earlier. Transferred to the ICU for closer monitoring. Case discussed with critical care specialist physician's assistant. 2. Clinical sepsis present on admission with leukopenia tachycardia and left lower lobe pneumonia with left pleural effusion. Case discussed with critical care specialist physician's assistant for opinion on whether a thoracentesis would be needed or not. 3. Pancytopenia. History of cryptogenic cirrhosis 4. History of esophageal varices on labetalol 5. Type 2 diabetes. With lactic acidosis I did stop metformin. Stat sugar of 75. Hold glipizide also. 6. Hypothyroidism unspecified on Synthroid 7. GERD on Protonix 8. History of esophageal cancer 9. Wife states he had a fall a few weeks ago and hurt his ribs and then started coughing afterwards. She was wondering if the patient had a rib fracture.  Code Status:     Code Status Orders        Start     Ordered   12/23/2015 2103  Full code  Continuous      01/04/2016 2102    Code Status History    Date Active Date Inactive Code Status Order ID Comments User Context   06/22/2015 12:33 PM 06/25/2015  5:56 PM Full Code [id number]  Hillary Bow, MD ED     Family Communication: Case discussed with wife on the phone Disposition Plan: Transfer to ICU stepdown  Consultants:  Critical care specialist  Antibiotics:  Vancomycin  Zosyn  Zithromax  Time spent: Another 30 minutes. With change in status since this morning, I will transfer to the critical care unit for closer monitoring.  Loletha Grayer  [employer]

## 2016-01-06 NOTE — Progress Notes (Signed)
Respiratory therapist notified this RN (since primary RN Daleen Snook was currently unavailable) that patient did not have any lung sounds on left side, upper & lower; this RN verified no air movement noted.   Dr. Leslye Peer notified patient has increased lethargy, oxygen saturations recently decreased to 60's and had to be placed on 3L oxygen. VSS currently stable. Dr. Leslye Peer to come see patient.   Patient's primary RN Daleen Snook notified of patient's change in status.

## 2016-01-06 NOTE — Care Management Important Message (Signed)
Important Message  Patient Details  Name: Mark Ford MRN: [id number] Date of Birth: 1945/11/08   Medicare Important Message Given:  Yes    Shelbie Ammons, RN 01/06/2016, 11:04 AM

## 2016-01-06 NOTE — Consult Note (Signed)
Name: Mark Ford MRN: [id number] DOB: [birth year]    ADMISSION DATE:  12/30/2015 CONSULTATION DATE:  01/06/16 REFERRING MD : Dr. Earleen Newport  CHIEF COMPLAINT:  Respiratory distress  BRIEF PATIENT DESCRIPTION:  70 year old male with CAD, cryptogenic liver cirrhosis, diabetes mellitus, good, status post CABG, thyroid disease . Patient came on 7/26 with left lower lobe pneumonia. Patient became acutely hypoxic on 7/27. Was started on 3 L of oxygen. O2 sat improved. There is concern for left side pleural effusion versus pneumonia. Patient will be monitored closely in the ICU overnight  SIGNIFICANT EVENTS  7/26>>> patient admitted  To [facility] with left lobe PNA 7/27>>Patient noted to be hypoxic with sats down to 60%, will monitor closely in the ICU  STUDIES:  None   HISTORY OF PRESENT ILLNESS:  Mark Ford is a 70 year old male with PMH significant for CAD, cryptogenic liver disease, diabetes mellitus, esophageal varices, GERD, hyperlipidemia, status post CABG. Patient presents to the ED on 7/26 with nausea, tremors and some abdominal pain. Subsequently he started having increased shortness of breath. chest x-ray was concerning for left Lower lobe PNA. Patient was started on broad-spectrum antibiotics. On 7/27 patient became acutely hypoxic with O2 sats down to 60%. Patient was started on 3 L nasal cannula. Patient sats were upto 95%. CCM team was consulted as this is an acute change. Patient will be monitored closely in the ICU overnight.  PAST MEDICAL HISTORY :   has a past medical history of CAD (coronary artery disease); Cancer ([place]); Cirrhosis, cryptogenic ([place]); Diabetes mellitus without complication ([place]); Esophageal varices ([facility]); GERD (gastroesophageal reflux disease); Heart failure ([place]); Hyperlipidemia; S/P CABG (coronary artery bypass graft); and Thyroid disease.  has a past surgical history that includes Coronary artery bypass graft; Esophagogastroduodenoscopy (egd) with propofol  (N/A, 02/11/2015); Esophagogastroduodenoscopy (egd) with propofol (N/A, 06/23/2015); Esophagogastroduodenoscopy (egd) with propofol (N/A, 07/26/2015); and EUS (N/A, 10/07/2015). Prior to Admission medications   Medication Sig Start Date End Date Taking? Authorizing Provider  allopurinol (ZYLOPRIM) 300 MG tablet Take 300 mg by mouth daily. Take in the morning   Yes Historical Provider, MD  glipiZIDE (GLUCOTROL XL) 10 MG 24 hr tablet Take 10 mg by mouth daily with breakfast.   Yes Historical Provider, MD  levothyroxine (SYNTHROID, LEVOTHROID) 100 MCG tablet Take 100 mcg by mouth daily before breakfast.  08/24/14  Yes Historical Provider, MD  metFORMIN (GLUCOPHAGE) 1000 MG tablet Take 1,000 mg by mouth 2 (two) times daily with a meal.   Yes Historical Provider, MD  nadolol (CORGARD) 20 MG tablet 1/2 tablet orally every evening 06/25/15  Yes Loletha Grayer, MD  pantoprazole (PROTONIX) 40 MG tablet Take 1 tablet (40 mg total) by mouth 2 (two) times daily. 06/25/15  Yes Loletha Grayer, MD  simvastatin (ZOCOR) 40 MG tablet Take 40 mg by mouth daily at 6 PM.  07/14/15  Yes Historical Provider, MD  spironolactone (ALDACTONE) 50 MG tablet Take 1 tablet by mouth daily. 07/06/15 07/05/16 Yes Historical Provider, MD   Allergies  Allergen Reactions  . Shellfish Allergy Other (See Comments)    GOUT  . Tylenol [Acetaminophen] Other (See Comments)    Not allergic, but was told not to take it any more due to his liver condition.     FAMILY HISTORY:  family history is not on file. SOCIAL HISTORY:  reports that he has never smoked. He has never used smokeless tobacco. He reports that he does not drink alcohol or use drugs.  REVIEW OF SYSTEMS:  Constitutional: Negative for fever, chills, weight loss, malaise/fatigue and diaphoresis.  HENT: Negative for hearing loss, ear pain, nosebleeds, congestion, sore throat, neck pain, tinnitus and ear discharge.   Eyes: Negative for blurred vision, double vision, photophobia,  pain, discharge and redness.  Respiratory: Negative for cough, hemoptysis, sputum production, shortness of breath, wheezing and stridor.   Cardiovascular: Negative for chest pain, palpitations, orthopnea, claudication, leg swelling and PND.  Gastrointestinal: Negative for heartburn, nausea, vomiting, abdominal pain, diarrhea, constipation, blood in stool and melena.  Genitourinary: Negative for dysuria, urgency, frequency, hematuria and flank pain.  Musculoskeletal: Negative for myalgias, back pain, joint pain and falls.  Skin: Negative for itching and rash.  Neurological: Negative for dizziness, tingling, tremors, sensory change, speech change, focal weakness, seizures, loss of consciousness, weakness and headaches.  Endo/Heme/Allergies: Negative for environmental allergies and polydipsia. Does not bruise/bleed easily.  SUBJECTIVE:  Patient states that he has been feeling better since he was started on oxygen  VITAL SIGNS: Temp:  [97.6 F (36.4 C)-99.8 F (37.7 C)] 98.6 F (37 C) (07/27 1431) Pulse Rate:  [76-83] 80 (07/27 1431) Resp:  [16-28] 20 (07/27 1431) BP: (89-136)/(39-92) 115/46 (07/27 1431) SpO2:  [67 %-100 %] 100 % (07/27 1431) FiO2 (%):  [28 %] 28 % (07/26 2002) Weight:  [205 lb (93 kg)-205 lb 4.8 oz (93.1 kg)] 205 lb 4.8 oz (93.1 kg) (07/26 2030)  PHYSICAL EXAMINATION: General: Elderly white male, found on nasal cannula, appears to be in no acute distress Neuro:  Awake, alert, oriented, follows commands, no focal deficits HEENT: Atraumatic, normocephalic, no,discharge, no JVD appreciated Cardiovascular:  S1-S2, regular, no MRG noted Lungs:  Diminished throughout, no wheezes, crackles, rhonchi noted Abdomen: Round, obese, active bowel sounds Musculoskeletal: +2 edema noted on bilateral lower extremities Skin:  Grossly intact   Recent Labs Lab 12/20/2015 1713 01/06/16 0009  NA 143 143  K 4.6 3.7  CL 105 109  CO2 27 26  BUN 13 16  CREATININE 0.72 1.02  GLUCOSE 78  87    Recent Labs Lab 12/13/2015 1743 01/06/16 0009  HGB 10.2* 8.7*  HCT 32.6* 28.4*  WBC 1.6* 2.6*  PLT 92* 61*   Dg Chest 2 View  Result Date: 01/06/2016 CLINICAL DATA:  Nausea and vomiting beginning today. Hypoxia and weakness. Shortness of breath. EXAM: CHEST  2 VIEW COMPARISON:  Two-view chest x-ray 07/13/2015. FINDINGS: The heart is enlarged. A right IJ Port-A-Cath is stable. The tip is at the cavoatrial junction. Lung volumes are low. Median sternotomy for CABG is noted. Left lower lobe pneumonia is present. A left pleural effusion is noted. IMPRESSION: 1. New left lower lobe pneumonia and pleural effusion. 2. Low lung volumes. 3. Stable right IJ Port-A-Cath. Electronically Signed   By: San Morelle M.D.   On: 12/31/2015 18:28   ASSESSMENT / PLAN:  Acute respiratory distress requiring oxygen Left lower lobe pneumonia/effusion  Lactic acidosis History of cryptogenic cirrhosis  History of esophageal varices Leukopenia Thrombocytopenia   Plan CXR  Bronchodilators Check ammonia level Antibiotics per primary Follow cultures Left sided effusion , no interventions required at this time.  Will watch closely Will observe him in the ICU overnight Rest per primary     Bincy Varughese,AG-ACNP Pulmonary and [place]   01/06/2016, 3:30 PM   PCCM ATTENDING ATTESTATION:  I have evaluated patient with ANP Varughese, reviewed database in its entirety and discussed care plan in detail. In addition, this patient was discussed on multidisciplinary rounds.   Important  exam findings: Lethargy No respiratory distress on [employer] O2 Diminished BS, bibasilar crackles, no wheezes Reg, no M Abdomen soft, +BS Symmetric LE edema No focal neurological deficits  Major problems addressed by PCCM team: Multiple chronic medical problems inc DM, cryptogenic cirrhosis, CAD Suspected CAP, NOS Transient hypoxemia Lethargy, AMS - concern for hepatic  encephalopathy   PLAN/REC: Monitor in ICU/SDU Empiric abx Check ammonia level CXR in AM 07/28    Merton Border, MD PCCM service Mobile [phone] Pager [phone]

## 2016-01-06 NOTE — Progress Notes (Signed)
Patient ID: Mark Ford, male   DOB: 1945/07/26, 70 y.o.   MRN: [id number]  [employer] PROGRESS NOTE  Mark Ford [id number] DOB: Sep 22, 1945 DOA: 12/19/2015 PCP: Mark Pink, MD  HPI/Subjective: Patient feeling better than he did yesterday. He had nausea vomiting yesterday. He does not have any complaints of shortness of breath or cough. He was found to have a left lower lobe pneumonia and clinical sepsis.  Objective: Vitals:   01/06/16 0412 01/06/16 0812  BP: (!) 109/53 103/61  Pulse: 83 76  Resp: 16 16  Temp: 98 F (36.7 C) 97.6 F (36.4 C)    Filed Weights   12/27/2015 1654 12/15/2015 2030  Weight: 93 kg (205 lb) 93.1 kg (205 lb 4.8 oz)    ROS: Review of Systems  Constitutional: Negative for chills and fever.  Eyes: Negative for blurred vision.  Respiratory: Negative for cough and shortness of breath.   Cardiovascular: Negative for chest pain.  Gastrointestinal: Positive for nausea and vomiting. Negative for abdominal pain, constipation and diarrhea.  Genitourinary: Negative for dysuria.  Musculoskeletal: Negative for joint pain.  Neurological: Negative for dizziness and headaches.   Exam: Physical Exam  Constitutional: He is oriented to person, place, and time.  HENT:  Nose: No mucosal edema.  Mouth/Throat: No oropharyngeal exudate or posterior oropharyngeal edema.  Eyes: Conjunctivae, EOM and lids are normal. Pupils are equal, round, and reactive to light.  Neck: No JVD present. Carotid bruit is not present. No edema present. No thyroid mass and no thyromegaly present.  Cardiovascular: S1 normal and S2 normal.  Exam reveals no gallop.   No murmur heard. Pulses:      Dorsalis pedis pulses are 2+ on the right side, and 2+ on the left side.  Respiratory: No respiratory distress. He has decreased breath sounds in the left middle field and the left lower field. He has no wheezes. He has no rhonchi. He has rales in the left lower field.  GI: Soft. Bowel  sounds are normal. He exhibits distension. There is no tenderness.  Musculoskeletal:       Right ankle: He exhibits swelling.       Left ankle: He exhibits swelling.  Lymphadenopathy:    He has no cervical adenopathy.  Neurological: He is alert and oriented to person, place, and time. No cranial nerve deficit.  Skin: Skin is warm. No rash noted. Nails show no clubbing.  Psychiatric: He has a normal mood and affect.    Data Reviewed: Basic Metabolic Panel:  Recent Labs Lab 12/22/2015 1713 01/06/16 0009  NA 143 143  K 4.6 3.7  CL 105 109  CO2 27 26  GLUCOSE 78 87  BUN 13 16  CREATININE 0.72 1.02  CALCIUM 9.8 8.6*   Liver Function Tests:  Recent Labs Lab 01/01/2016 1713  AST 47*  ALT 13*  ALKPHOS 58  BILITOT 1.4*  PROT 7.1  ALBUMIN 4.0    Recent Labs Lab 12/18/2015 1713  LIPASE 22   CBC:  Recent Labs Lab 12/19/2015 1743 01/06/16 0009  WBC 1.6* 2.6*  HGB 10.2* 8.7*  HCT 32.6* 28.4*  MCV 78.7* 78.0*  PLT 92* 61*   Cardiac Enzymes:  Recent Labs Lab 12/21/2015 1713  TROPONINI <0.03    CBG:  Recent Labs Lab 12/20/2015 2053  GLUCAP 94    Recent Results (from the past 240 hour(s))  Blood Culture (routine x 2)     Status: None (Preliminary result)   Collection Time: 12/28/2015  5:43 PM  Result Value Ref Range Status   Specimen Description BLOOD RIGHT ASSIST CONTROL  Final   Special Requests BOTTLES DRAWN AEROBIC AND ANAEROBIC [name]  Final   Culture NO GROWTH < 24 HOURS  Final   Report Status PENDING  Incomplete  Blood Culture (routine x 2)     Status: None (Preliminary result)   Collection Time: 01/03/2016  5:44 PM  Result Value Ref Range Status   Specimen Description BLOOD LEFT ASSIST CONTROL  Final   Special Requests BOTTLES DRAWN AEROBIC AND ANAEROBIC [name]  Final   Culture NO GROWTH < 24 HOURS  Final   Report Status PENDING  Incomplete     Studies: Dg Chest 2 View  Result Date: 12/25/2015 CLINICAL DATA:  Nausea and vomiting beginning today. Hypoxia  and weakness. Shortness of breath. EXAM: CHEST  2 VIEW COMPARISON:  Two-view chest x-ray 07/13/2015. FINDINGS: The heart is enlarged. A right IJ Port-A-Cath is stable. The tip is at the cavoatrial junction. Lung volumes are low. Median sternotomy for CABG is noted. Left lower lobe pneumonia is present. A left pleural effusion is noted. IMPRESSION: 1. New left lower lobe pneumonia and pleural effusion. 2. Low lung volumes. 3. Stable right IJ Port-A-Cath. Electronically Signed   By: San Morelle M.D.   On: 12/12/2015 18:28   Scheduled Meds: . allopurinol  300 mg Oral Daily  . [START ON 01/07/2016] azithromycin  250 mg Oral Daily  . glipiZIDE  10 mg Oral Q breakfast  . ipratropium-albuterol  3 mL Nebulization Q6H  . levothyroxine  100 mcg Oral QAC breakfast  . nadolol  10 mg Oral QPM  . pantoprazole  40 mg Oral BID  . piperacillin-tazobactam (ZOSYN)  IV  3.375 g Intravenous Q8H  . simvastatin  40 mg Oral q1800  . spironolactone  50 mg Oral Daily  . vancomycin  1,250 mg Intravenous Q12H    Assessment/Plan:  1. Clinical sepsis present on admission with leukopenia, tachycardia and left lower lobe pneumonia. Patient also had worsening lactic acidosis. I will stop Glucophage. Continue aggressive antibiotics with vancomycin and Zosyn and Zithromax. Patient clinically improved. Patient has left lower lobe effusion also. Looking back on previous CAT scan in January patient had bilateral pleural effusions at that time. Continue to monitor clinically at this point. 2. Pancytopenia. Does have a history of cryptogenic cirrhosis which is likely the cause. 3. History of esophageal varices on labetalol 4. Type 2 diabetes continue glipizide and stop metformin 5. Hypothyroidism unspecified on Synthroid 6. GERD on Protonix 7. History of esophageal cancer  Code Status:     Code Status Orders        Start     Ordered   12/22/2015 2103  Full code  Continuous     12/17/2015 2102    Code Status  History    Date Active Date Inactive Code Status Order ID Comments User Context   06/22/2015 12:33 PM 06/25/2015  5:56 PM Full Code [id number]  Hillary Bow, MD ED     Disposition Plan: Potentially home soon depending on clinical course  Antibiotics:  Vancomycin  Zosyn  Zithromax  Time spent: 30 minutes  [place], [place]

## 2016-01-07 ENCOUNTER — Inpatient Hospital Stay: Payer: Medicare Other

## 2016-01-07 ENCOUNTER — Encounter: Payer: Self-pay | Admitting: *Deleted

## 2016-01-07 LAB — URINE CULTURE

## 2016-01-07 LAB — BLOOD GAS, ARTERIAL
ALLENS TEST (PASS/FAIL): POSITIVE — AB
Acid-base deficit: 5.2 mmol/L — ABNORMAL HIGH (ref 0.0–2.0)
Acid-base deficit: 4.7 mmol/L — ABNORMAL HIGH (ref 0.0–2.0)
Acid-base deficit: 4.3 mmol/L — ABNORMAL HIGH (ref 0.0–2.0)
Allens test (pass/fail): POSITIVE — AB
Bicarbonate: 27.1 meq/L (ref 21.0–28.0)
Bicarbonate: 24.9 meq/L (ref 21.0–28.0)
Bicarbonate: 23.1 mEq/L (ref 21.0–28.0)
DELIVERY SYSTEMS: POSITIVE
Delivery systems: POSITIVE
EXPIRATORY PAP: 8
Expiratory PAP: 8
FIO2: 0.4
FIO2: 0.4
FIO2: 0.4
INSPIRATORY PAP: 20
Inspiratory PAP: 20
Mechanical Rate: 12
O2 Saturation: 75.2 %
O2 Saturation: 92.2 %
O2 Saturation: 97.9 %
Patient temperature: 37
Patient temperature: 37
Patient temperature: 37
pCO2 arterial: 105 mmHg (ref 32.0–48.0)
pCO2 arterial: 75 mmHg (ref 32.0–48.0)
pCO2 arterial: 54 mmHg — ABNORMAL HIGH (ref 32.0–48.0)
pH, Arterial: 7.02 — CL (ref 7.350–7.450)
pH, Arterial: 7.13 — CL (ref 7.350–7.450)
pH, Arterial: 7.24 — ABNORMAL LOW (ref 7.350–7.450)
pO2, Arterial: 60 mmHg — ABNORMAL LOW (ref 83.0–108.0)
pO2, Arterial: 84 mmHg (ref 83.0–108.0)
pO2, Arterial: 119 mmHg — ABNORMAL HIGH (ref 83.0–108.0)

## 2016-01-07 LAB — HEPATIC FUNCTION PANEL
ALT: 13 U/L — ABNORMAL LOW (ref 17–63)
AST: 30 U/L (ref 15–41)
Albumin: 3.7 g/dL (ref 3.5–5.0)
Alkaline Phosphatase: 43 U/L (ref 38–126)
BILIRUBIN DIRECT: 0.6 mg/dL — AB (ref 0.1–0.5)
BILIRUBIN INDIRECT: 1.2 mg/dL — AB (ref 0.3–0.9)
Total Bilirubin: 1.8 mg/dL — ABNORMAL HIGH (ref 0.3–1.2)
Total Protein: 6.9 g/dL (ref 6.5–8.1)

## 2016-01-07 LAB — TYPE AND SCREEN
ABO/RH(D): O POS
ANTIBODY SCREEN: NEGATIVE

## 2016-01-07 LAB — GLUCOSE, CAPILLARY
GLUCOSE-CAPILLARY: 136 mg/dL — AB (ref 65–99)
Glucose-Capillary: 130 mg/dL — ABNORMAL HIGH (ref 65–99)
Glucose-Capillary: 114 mg/dL — ABNORMAL HIGH (ref 65–99)

## 2016-01-07 LAB — CBC
HCT: 33 % — ABNORMAL LOW (ref 40.0–52.0)
HEMATOCRIT: 30.9 % — AB (ref 40.0–52.0)
Hemoglobin: 9.9 g/dL — ABNORMAL LOW (ref 13.0–18.0)
Hemoglobin: 9.4 g/dL — ABNORMAL LOW (ref 13.0–18.0)
MCH: 24.1 pg — AB (ref 26.0–34.0)
MCH: 24.6 pg — ABNORMAL LOW (ref 26.0–34.0)
MCHC: 29.9 g/dL — ABNORMAL LOW (ref 32.0–36.0)
MCHC: 30.6 g/dL — ABNORMAL LOW (ref 32.0–36.0)
MCV: 80.5 fL (ref 80.0–100.0)
MCV: 80.6 fL (ref 80.0–100.0)
PLATELETS: 111 10*3/uL — AB (ref 150–440)
PLATELETS: 81 10*3/uL — AB (ref 150–440)
RBC: 4.1 MIL/uL — ABNORMAL LOW (ref 4.40–5.90)
RBC: 3.83 MIL/uL — AB (ref 4.40–5.90)
RDW: 22.4 % — AB (ref 11.5–14.5)
RDW: 22.8 % — AB (ref 11.5–14.5)
WBC: 16.4 10*3/uL — AB (ref 3.8–10.6)
WBC: 11.1 10*3/uL — AB (ref 3.8–10.6)

## 2016-01-07 LAB — PHOSPHORUS: Phosphorus: 7.3 mg/dL — ABNORMAL HIGH (ref 2.5–4.6)

## 2016-01-07 LAB — BASIC METABOLIC PANEL
Anion gap: 8 (ref 5–15)
BUN: 34 mg/dL — AB (ref 6–20)
CALCIUM: 8 mg/dL — AB (ref 8.9–10.3)
CHLORIDE: 106 mmol/L (ref 101–111)
CO2: 26 mmol/L (ref 22–32)
CREATININE: 2.97 mg/dL — AB (ref 0.61–1.24)
GFR calc Af Amer: 23 mL/min — ABNORMAL LOW (ref >60)
GFR calc non Af Amer: 20 mL/min — ABNORMAL LOW (ref >60)
Glucose, Bld: 126 mg/dL — ABNORMAL HIGH (ref 65–99)
Potassium: 5.3 mmol/L — ABNORMAL HIGH (ref 3.5–5.1)
SODIUM: 140 mmol/L (ref 135–145)

## 2016-01-07 LAB — MRSA PCR SCREENING: MRSA BY PCR: NEGATIVE

## 2016-01-07 LAB — MAGNESIUM: Magnesium: 1.5 mg/dL — ABNORMAL LOW (ref 1.7–2.4)

## 2016-01-07 LAB — LACTIC ACID, PLASMA: Lactic Acid, Venous: 1.6 mmol/L (ref 0.5–1.9)

## 2016-01-07 LAB — PROTIME-INR
INR: 1.86
PROTHROMBIN TIME: 21.7 s — AB (ref 11.4–15.2)

## 2016-01-07 MED ORDER — DEXTROSE-NACL 5-0.9 % IV SOLN: INTRAVENOUS | Status: DC

## 2016-01-07 MED ORDER — SODIUM CHLORIDE 0.9 % IV SOLN
INTRAVENOUS | Status: DC
  Administered 2016-01-07: 11:00:00 via INTRAVENOUS

## 2016-01-07 MED ORDER — LORAZEPAM 2 MG/ML PO CONC: 1.0000 mg | ORAL | Status: DC | PRN

## 2016-01-07 MED ORDER — MORPHINE SULFATE (PF) 2 MG/ML IV SOLN
2.0000 mg | INTRAVENOUS | Status: DC | PRN
  Administered 2016-01-07: 2 mg via INTRAVENOUS
  Administered 2016-01-07: 3 mg via INTRAVENOUS
  Filled 2016-01-07: qty 1
  Filled 2016-01-07: qty 3

## 2016-01-07 MED ORDER — DIATRIZOATE MEGLUMINE & SODIUM 66-10 % PO SOLN: 15.0000 mL | ORAL | Status: AC

## 2016-01-07 MED ORDER — MAGNESIUM SULFATE 2 GM/50ML IV SOLN
2.0000 g | Freq: Once | INTRAVENOUS | Status: AC
  Administered 2016-01-07: 2 g via INTRAVENOUS
  Filled 2016-01-07: qty 50

## 2016-01-07 MED ORDER — SODIUM CHLORIDE 0.9 % IV BOLUS (SEPSIS)
1000.0000 mL | Freq: Once | INTRAVENOUS | Status: AC
  Administered 2016-01-07: 1000 mL via INTRAVENOUS

## 2016-01-07 MED ORDER — LORAZEPAM 1 MG PO TABS
1.0000 mg | ORAL_TABLET | ORAL | Status: DC | PRN
Start: 2016-01-07 — End: 2016-01-08

## 2016-01-07 MED ORDER — HEPARIN SODIUM (PORCINE) 5000 UNIT/ML IJ SOLN
5000.0000 [IU] | Freq: Three times a day (TID) | INTRAMUSCULAR | Status: DC
Start: 2016-01-07 — End: 2016-01-07
  Administered 2016-01-07 (×2): 5000 [IU] via SUBCUTANEOUS
  Filled 2016-01-07 (×2): qty 1

## 2016-01-07 MED ORDER — LORAZEPAM 2 MG/ML IJ SOLN: 1.0000 mg | INTRAMUSCULAR | Status: DC | PRN

## 2016-01-07 NOTE — Progress Notes (Signed)
Name: Mark Ford MRN: [id number] DOB: April 11, 1946    ADMISSION DATE:  12/31/2015 CONSULTATION DATE:  01/06/16 REFERRING MD : Dr. Earleen Newport  CHIEF COMPLAINT:  Respiratory distress  ASSESSMENT / PLAN:  PULMONARY Acute hypoxic respiratory failure on Bipap, possibly due to sepsis.  --Continue abx coverage, continue bipap as needed.  --Review of CXR images 7/28-- small bilateral effusion, doubt pneumonia.  --Review of ABG--7.24/54/119/23 c/w acute on ch ronic hypercapnic resp failure.   CARDIOVASCULAR A: Sepsis.   RENAL A:   AKI on CKD.  Suspect ATN vs. Hepatorenal syndrome.  P:   Would try fluids empirically, but pt's respiratory status makes this tenuous.   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  Grade 1 esophageal varices last EGD on 07/26/15.  Cryptogenic cirrhosis with portal hypertension.     HEMATOLOGIC A: --  INFECTIOUS A:   Sepsis; uncertain source.  --Continue abx. Limit fluids given ascites and volume overload.   Micro/culture results: MRSA PCR 7/27 negative.  BCx2 7/26; negative thus far.  UC contaminant.  Sputum--  Antibiotics: Piperacillin/tazobactam 07/26 >> Vancomycin 07/26 >>   ENDOCRINE A:  Glucose controlled.   NEUROLOGIC A:   Acute metabolic and/or hepatic encephalopathy.  --CT head.  --Ammonia level.    MAJOR EVENTS/TEST RESULTS:   Best Practices  DVT Prophylaxis: heparin SQ.  GI Prophylaxis: protonix.     BRIEF PATIENT DESCRIPTION:  70 year old male with CAD, cryptogenic liver cirrhosis, diabetes mellitus, good, status post CABG, thyroid disease . Patient came on 7/26 with left lower lobe pneumonia. Patient became acutely hypoxic on 7/27. Was started on 3 L of oxygen. O2 sat improved. There is concern for left side pleural effusion versus pneumonia. Patient will be monitored closely in the ICU overnight  SIGNIFICANT EVENTS  7/26>>> patient admitted  To [facility] with left lobe  PNA 7/27>>Patient noted to be hypoxic with sats down to 60%, will monitor closely in the ICU  STUDIES:  None   HISTORY OF PRESENT ILLNESS:  Mark Ford is a 70 year old male with PMH significant for CAD, cryptogenic liver disease, diabetes mellitus, esophageal varices, GERD, hyperlipidemia, status post CABG. Patient presents to the ED on 7/26 with nausea, tremors and some abdominal pain. Subsequently he started having increased shortness of breath. chest x-ray was concerning for left Lower lobe PNA. Patient was started on broad-spectrum antibiotics. On 7/27 patient became acutely hypoxic with O2 sats down to 60%. Patient was started on 3 L nasal cannula. Patient sats were upto 95%. CCM team was consulted as this is an acute change. Patient will be monitored closely in the ICU overnight.  SUBJECTIVE:  Patient complaining of diffuse abdominal pain, 8 on 10 in intensity,No associated symptoms, and no alleviating factors. Pain started 2 days ago and has gradually gotten worse. Patient has a history of GERD, esophageal varices with bleeding, cryptogenic cirrhosis, and adenocarcinoma of the GE junction. Dilaudid 0.5 mg given with mild-to-moderate relief in abdominal pain. SPO2 dropping down to 80% on 3 L nasal cannula. Stat ABG shows acute hypercarbic respiratory failure with a PCO2 of 105. Patient placed on BiPAP  VITAL SIGNS: Temp:  [97.2 F (36.2 C)-98.9 F (37.2 C)] 97.2 F (36.2 C) (07/28 0500) Pulse Rate:  [56-83] 66 (07/28 0600) Resp:  [13-28] 19 (07/28 0600) BP: (69-134)/(40-93) 120/51 (07/28 0600) SpO2:  [67 %-100 %] 84 % (07/28 0600) FiO2 (%):  [40 %] 40 % (07/28 0500)  PHYSICAL EXAMINATION: General: Elderly white male,  appears to be in Moderate distress Neuro:  Awake, alert, oriented, follows commands, no focal deficits HEENT: Atraumatic, normocephalic, no,discharge, no JVD appreciated Cardiovascular:  S1-S2, regular, no MRG Lungs:  Diminished throughout, no wheezes, crackles,  rhonchi noted Abdomen: Round, obese, active bowel sounds, Diffuse pain on gentle palpation Musculoskeletal: +2 edema noted on bilateral lower extremities Skin:  Grossly intact   Recent Labs Lab 01/06/16 0009 01/06/16 2233 01/07/16 0140  NA 143 139 140  K 3.7 4.8 5.3*  CL 109 105 106  CO2 26 25 26   BUN 16 32* 34*  CREATININE 1.02 2.55* 2.97*  GLUCOSE 87 119* 126*    Recent Labs Lab 01/06/16 0009 01/06/16 2233 01/07/16 0140  HGB 8.7* 9.1* 9.9*  HCT 28.4* 30.3* 33.0*  WBC 2.6* 10.4 16.4*  PLT 61* 71* 111*   Dg Chest 2 View  Result Date: 12/12/2015 CLINICAL DATA:  Nausea and vomiting beginning today. Hypoxia and weakness. Shortness of breath. EXAM: CHEST  2 VIEW COMPARISON:  Two-view chest x-ray 07/13/2015. FINDINGS: The heart is enlarged. A right IJ Port-A-Cath is stable. The tip is at the cavoatrial junction. Lung volumes are low. Median sternotomy for CABG is noted. Left lower lobe pneumonia is present. A left pleural effusion is noted. IMPRESSION: 1. New left lower lobe pneumonia and pleural effusion. 2. Low lung volumes. 3. Stable right IJ Port-A-Cath. Electronically Signed   By: San Morelle M.D.   On: 01/06/2016 18:28  Dg Abd 1 View  Result Date: 01/06/2016 CLINICAL DATA:  70 year old male with abdominal pain EXAM: ABDOMEN - 1 VIEW COMPARISON:  Abdominal CT dated 12/02/2014 FINDINGS: There is a nonobstructive bowel gas pattern. Air is noted within the colon. No definite free air identified. No radiopaque calculi. There is degenerative changes of the spine. The soft tissues are grossly unremarkable. IMPRESSION: No evidence of bowel obstruction. Electronically Signed   By: Anner Crete M.D.   On: 01/06/2016 22:34  Dg Chest Port 1 View  Result Date: 01/06/2016 CLINICAL DATA:  History of pneumonia EXAM: PORTABLE CHEST 1 VIEW COMPARISON:  12/21/2015 FINDINGS: Cardiac shadow is again enlarged. A right-sided chest wall port is again seen. Patchy changes are again noted  in the left lung base. Some increasing atelectatic changes are noted in the right lung base. Small bilateral pleural effusions are noted. Postsurgical changes are again seen. IMPRESSION: Bibasilar changes increased particularly on the right from the prior exam with small pleural effusions. Electronically Signed   By: Inez Catalina M.D.   On: 01/06/2016 22:35      Best Practice: Code Status:  Full. Diet: Heart Healthy / Carb Mod-NPO when on BiPAP GI prophylaxis:  PPI. VTE prophylaxis:  SCD's / heparin.  Magdalene S. [facility] ANP-BC Pulmonary and Critical Care Medicine [facility] Pager [phone] or [phone]  Marda Stalker, M.D 01/07/2016   [place].  I have personally obtained a history, examined the patient, evaluated laboratory and imaging results, formulated the assessment and plan and placed orders. The Patient requires high complexity decision making for assessment and support, frequent evaluation and titration of therapies, application of advanced monitoring technologies and extensive interpretation of multiple databases. The patient has critical illness that could lead imminently to failure of 1 or more organ systems and requires the highest level of physician preparedness to intervene.  Critical Care Time devoted to patient care services described in this note is 35 minutes and is exclusive of time spent in procedures.

## 2016-01-07 NOTE — Consult Note (Signed)
[facility] CONSULT NOTE    Date: 01/07/2016                  Patient Name:  Mark Ford  MRN: [id number]  DOB: 25-Jul-1945  Age / Sex: 70 y.o., male         PCP: Maryland Pink, MD                 Service Requesting Consult: Pulmonary/Critical Care                 Reason for Consult: Acute renal failure            History of Present Illness: Patient is a 70 y.o. male with a PMHx of Coronary artery disease, adenocarcinoma of the GE junction, cryptogenic cirrhosis, diabetes mellitus type 2, esophageal varices, GERD, hyperlipidemia, who was admitted to [facility] on 01/06/2016 for evaluation of nausea and abdominal pain.  Apparently the patient was given some Gas-X for his abdominal pain but this did not improve his symptoms. He was having tremors at home and his wife attempted to bring him to the emergency department. While in transit however he developed shortness of breath and enforcing the patient had to pull over and the ambulance was called. Upon workup in the emergency department the patient was apparently found to have a pneumonia. Unfortunately he's had worsening in his respiratory status and yesterday was moved to the critical care unit and placed on BiPAP. He is unable to provide any significant history at this point in time.  The patient has had sustained hypotension with blood pressure as low as 69/48. He was started on IV fluid hydration. The patient's renal function was normal upon admission with a creatinine of 0.72. Creatinine has risen to 2.97 today. Urine output is also significantly decreased. He was on spinal lactone at home.  Unclear if he was taking any NSAIDs at home.   Medications: Outpatient medications: Prescriptions Prior to Admission  Medication Sig Dispense Refill Last Dose  . allopurinol (ZYLOPRIM) 300 MG tablet Take 300 mg by mouth daily. Take in the morning   01/03/2016 at 1000  . glipiZIDE (GLUCOTROL XL) 10 MG 24 hr tablet Take 10 mg by  mouth daily with breakfast.   12/13/2015 at 1000  . levothyroxine (SYNTHROID, LEVOTHROID) 100 MCG tablet Take 100 mcg by mouth daily before breakfast.    01/01/2016 at 1000  . metFORMIN (GLUCOPHAGE) 1000 MG tablet Take 1,000 mg by mouth 2 (two) times daily with a meal.   01/07/2016 at 1000  . nadolol (CORGARD) 20 MG tablet 1/2 tablet orally every evening 15 tablet 0 01/04/2016 at Unknown time  . pantoprazole (PROTONIX) 40 MG tablet Take 1 tablet (40 mg total) by mouth 2 (two) times daily. 60 tablet 0 12/27/2015 at 1000  . simvastatin (ZOCOR) 40 MG tablet Take 40 mg by mouth daily at 6 PM.    01/04/2016 at Unknown time  . spironolactone (ALDACTONE) 50 MG tablet Take 1 tablet by mouth daily.   01/04/2016 at 1000    Current medications: Current Facility-Administered Medications  Medication Dose Route Frequency Provider Last Rate Last Dose  . allopurinol (ZYLOPRIM) tablet 300 mg  300 mg Oral Daily Henreitta Leber, MD   300 mg at 01/06/16 0935  . azithromycin (ZITHROMAX) tablet 250 mg  250 mg Oral Daily Loletha Grayer, MD      . diatrizoate meglumine-sodium (GASTROGRAFIN) 66-10 % solution 15 mL  15 mL Oral Q1 Hr x 2  Awilda Bill, NP      . heparin injection 5,000 Units  5,000 Units Subcutaneous Q8H Mikael Spray, NP      . HYDROmorphone (DILAUDID) injection 0.5 mg  0.5 mg Intravenous Q3H PRN Mikael Spray, NP   0.5 mg at 01/06/16 2230  . insulin aspart (novoLOG) injection 0-5 Units  0-5 Units Subcutaneous QHS Loletha Grayer, MD      . insulin aspart (novoLOG) injection 0-9 Units  0-9 Units Subcutaneous TID WC Loletha Grayer, MD      . ipratropium-albuterol (DUONEB) 0.5-2.5 (3) MG/3ML nebulizer solution 3 mL  3 mL Nebulization Q6H Loletha Grayer, MD   3 mL at 01/07/16 [id number]  . levothyroxine (SYNTHROID, LEVOTHROID) tablet 100 mcg  100 mcg Oral QAC breakfast Henreitta Leber, MD   100 mcg at 01/06/16 0811  . magnesium sulfate IVPB 2 g 50 mL  2 g Intravenous Once Mikael Spray, NP      .  nadolol (CORGARD) tablet 10 mg  10 mg Oral QPM Henreitta Leber, MD   10 mg at 01/06/16 1746  . ondansetron (ZOFRAN) tablet 4 mg  4 mg Oral Q6H PRN Henreitta Leber, MD       Or  . ondansetron (ZOFRAN) injection 4 mg  4 mg Intravenous Q6H PRN Henreitta Leber, MD      . pantoprazole (PROTONIX) EC tablet 40 mg  40 mg Oral BID Henreitta Leber, MD   40 mg at 01/06/16 2116  . piperacillin-tazobactam (ZOSYN) IVPB 3.375 g  3.375 g Intravenous Q8H Merilyn Baba, RPH   3.375 g at 01/07/16 0519  . simvastatin (ZOCOR) tablet 40 mg  40 mg Oral q1800 Henreitta Leber, MD   40 mg at 01/06/16 1746  . spironolactone (ALDACTONE) tablet 50 mg  50 mg Oral Daily Henreitta Leber, MD   50 mg at 01/06/16 0935  . vancomycin (VANCOCIN) 1,250 mg in sodium chloride 0.9 % 250 mL IVPB  1,250 mg Intravenous Q12H Merilyn Baba, RPH   1,250 mg at 01/07/16 0330   Facility-Administered Medications Ordered in Other Encounters  Medication Dose Route Frequency Provider Last Rate Last Dose  . sodium chloride 0.9 % injection 10 mL  10 mL Intravenous PRN Lloyd Huger, MD   10 mL at 11/11/14 1045  . sodium chloride 0.9 % injection 10 mL  10 mL Intravenous PRN Lloyd Huger, MD   10 mL at 05/11/15 1104      Allergies: Allergies  Allergen Reactions  . Shellfish Allergy Other (See Comments)    GOUT  . Tylenol [Acetaminophen] Other (See Comments)    Not allergic, but was told not to take it any more due to his liver condition.       Past Medical History: Past Medical History:  Diagnosis Date  . CAD (coronary artery disease)   . Cancer [facility])    adenocarcinoma fo the GE Junction  . Cirrhosis, cryptogenic ([place])   . Diabetes mellitus without complication ([place])   . Esophageal varices ([place])   . GERD (gastroesophageal reflux disease)   . Heart failure ([place])   . Hyperlipidemia   . S/P CABG (coronary artery bypass graft)   . Thyroid disease    hypothroidism     Past Surgical History: Past Surgical History:   Procedure Laterality Date  . CORONARY ARTERY BYPASS GRAFT    . ESOPHAGOGASTRODUODENOSCOPY (EGD) WITH PROPOFOL N/A 02/11/2015   Procedure: ESOPHAGOGASTRODUODENOSCOPY (EGD) WITH PROPOFOL;  Surgeon:  Manya Silvas, MD;  Location: [facility] ENDOSCOPY;  Service: Endoscopy;  Laterality: N/A;  . ESOPHAGOGASTRODUODENOSCOPY (EGD) WITH PROPOFOL N/A 06/23/2015   Procedure: ESOPHAGOGASTRODUODENOSCOPY (EGD) WITH PROPOFOL;  Surgeon: Josefine Class, MD;  Location: [facility] ENDOSCOPY;  Service: Endoscopy;  Laterality: N/A;  . ESOPHAGOGASTRODUODENOSCOPY (EGD) WITH PROPOFOL N/A 07/26/2015   Procedure: ESOPHAGOGASTRODUODENOSCOPY (EGD) WITH PROPOFOL;  Surgeon: Josefine Class, MD;  Location: [facility] ENDOSCOPY;  Service: Endoscopy;  Laterality: N/A;  . EUS N/A 10/07/2015   Procedure: UPPER ENDOSCOPIC ULTRASOUND (EUS) LINEAR;  Surgeon: Cora Daniels, MD;  Location: [facility] ENDOSCOPY;  Service: Endoscopy;  Laterality: N/A;     Family History: Family History  Problem Relation Age of Onset  . Hypertension       Social History: Social History   Social History  . Marital status: Married    Spouse name: N/A  . Number of children: N/A  . Years of education: N/A   Occupational History  . Not on file.   Social History Main Topics  . Smoking status: Never Smoker  . Smokeless tobacco: Never Used  . Alcohol use No  . Drug use: No  . Sexual activity: Not on file   Other Topics Concern  . Not on file   Social History Narrative  . No narrative on file     Review of Systems: Unable to obtain from the patient as he's currently on BiPAP  Vital Signs: Blood pressure (!) 120/51, pulse 66, temperature 97.2 F (36.2 C), resp. rate 19, height 5\' 8"  (1.727 m), weight 93.1 kg (205 lb 4.8 oz), SpO2 94 %.  Weight trends: Filed Weights   12/13/2015 1654 01/07/2016 2030  Weight: 93 kg (205 lb) 93.1 kg (205 lb 4.8 oz)    Physical Exam: General: Critically ill appearing, on BiPAP   Head: Normocephalic,  atraumatic.  BiPAP face mask on   Eyes: Anicteric, Pupils sluggish to react   Nose: Mucous membranes moist, not inflammed, nonerythematous.  Throat: Oropharynx nonerythematous, no exudate appreciated.   Neck: Supple, trachea midline.  Lungs:  Bilateral rhonchi, on BiPAP   Heart: S1S2 no pericardial friction rub  Abdomen:  Mild distention, soft, nontender, bowel sounds present   Extremities: No pretibial edema.  Neurologic: Awake but not following commands   Skin: No visible rashes, scars.    Lab results: Basic Metabolic Panel:  Recent Labs Lab 01/06/16 0009 01/06/16 2233 01/07/16 0140  NA 143 139 140  K 3.7 4.8 5.3*  CL 109 105 106  CO2 26 25 26   GLUCOSE 87 119* 126*  BUN 16 32* 34*  CREATININE 1.02 2.55* 2.97*  CALCIUM 8.6* 8.1* 8.0*  MG  --   --  1.5*  PHOS  --   --  7.3*    Liver Function Tests:  Recent Labs Lab 12/23/2015 1713 01/06/16 2233 01/07/16 0140  AST 47* 30 30  ALT 13* 12* 13*  ALKPHOS 58 39 43  BILITOT 1.4* 1.9* 1.8*  PROT 7.1 6.7 6.9  ALBUMIN 4.0 3.4* 3.7    Recent Labs Lab 12/13/2015 1713 01/06/16 2233  LIPASE 22 13  AMYLASE  --  28    Recent Labs Lab 01/06/16 1537  AMMONIA 37*    CBC:  Recent Labs Lab 12/13/2015 1743 01/06/16 0009 01/06/16 2233 01/07/16 0140  WBC 1.6* 2.6* 10.4 16.4*  HGB 10.2* 8.7* 9.1* 9.9*  HCT 32.6* 28.4* 30.3* 33.0*  MCV 78.7* 78.0* 81.4 80.5  PLT 92* 61* 71* 111*    Cardiac Enzymes:  Recent  Labs Lab 12/23/2015 1713  TROPONINI <0.03    BNP: Invalid input(s): POCBNP  CBG:  Recent Labs Lab 12/27/2015 2053 01/06/16 1439 01/06/16 1523 01/06/16 2114 01/07/16 0736  GLUCAP 94 75 74 104* 136*    Microbiology: Results for orders placed or performed during the hospital encounter of 12/25/2015  Blood Culture (routine x 2)     Status: None (Preliminary result)   Collection Time: 12/24/2015  5:43 PM  Result Value Ref Range Status   Specimen Description BLOOD RIGHT ASSIST CONTROL  Final   Special  Requests BOTTLES DRAWN AEROBIC AND ANAEROBIC [place]  Final   Culture NO GROWTH < 24 HOURS  Final   Report Status PENDING  Incomplete  Blood Culture (routine x 2)     Status: None (Preliminary result)   Collection Time: 01/01/2016  5:44 PM  Result Value Ref Range Status   Specimen Description BLOOD LEFT ASSIST CONTROL  Final   Special Requests BOTTLES DRAWN AEROBIC AND ANAEROBIC [place]  Final   Culture NO GROWTH < 24 HOURS  Final   Report Status PENDING  Incomplete  Urine culture     Status: Abnormal   Collection Time: 01/02/2016  8:14 PM  Result Value Ref Range Status   Specimen Description URINE, RANDOM  Final   Special Requests NONE  Final   Culture MULTIPLE SPECIES PRESENT, SUGGEST RECOLLECTION (A)  Final   Report Status 01/07/2016 FINAL  Final  MRSA PCR Screening     Status: None   Collection Time: 01/06/16  4:52 PM  Result Value Ref Range Status   MRSA by PCR NEGATIVE NEGATIVE Final    Comment:        The GeneXpert MRSA Assay (FDA approved for NASAL specimens only), is one component of a comprehensive MRSA colonization surveillance program. It is not intended to diagnose MRSA infection nor to guide or monitor treatment for MRSA infections.     Coagulation Studies:  Recent Labs  01/07/16 0140  LABPROT 21.7*  INR 1.86    Urinalysis:  Recent Labs  12/13/2015 2013  COLORURINE YELLOW*  LABSPEC 1.014  PHURINE 5.0  GLUCOSEU NEGATIVE  HGBUR NEGATIVE  BILIRUBINUR NEGATIVE  KETONESUR NEGATIVE  PROTEINUR 30*  NITRITE NEGATIVE  LEUKOCYTESUR TRACE*      Imaging: Dg Chest 2 View  Result Date: 12/25/2015 CLINICAL DATA:  Nausea and vomiting beginning today. Hypoxia and weakness. Shortness of breath. EXAM: CHEST  2 VIEW COMPARISON:  Two-view chest x-ray 07/13/2015. FINDINGS: The heart is enlarged. A right IJ Port-A-Cath is stable. The tip is at the cavoatrial junction. Lung volumes are low. Median sternotomy for CABG is noted. Left lower lobe pneumonia is present. A left  pleural effusion is noted. IMPRESSION: 1. New left lower lobe pneumonia and pleural effusion. 2. Low lung volumes. 3. Stable right IJ Port-A-Cath. Electronically Signed   By: San Morelle M.D.   On: 12/13/2015 18:28  Dg Abd 1 View  Result Date: 01/06/2016 CLINICAL DATA:  70 year old male with abdominal pain EXAM: ABDOMEN - 1 VIEW COMPARISON:  Abdominal CT dated 12/02/2014 FINDINGS: There is a nonobstructive bowel gas pattern. Air is noted within the colon. No definite free air identified. No radiopaque calculi. There is degenerative changes of the spine. The soft tissues are grossly unremarkable. IMPRESSION: No evidence of bowel obstruction. Electronically Signed   By: Anner Crete M.D.   On: 01/06/2016 22:34  Dg Chest Port 1 View  Result Date: 01/06/2016 CLINICAL DATA:  History of pneumonia EXAM: PORTABLE CHEST 1 VIEW  COMPARISON:  12/22/2015 FINDINGS: Cardiac shadow is again enlarged. A right-sided chest wall port is again seen. Patchy changes are again noted in the left lung base. Some increasing atelectatic changes are noted in the right lung base. Small bilateral pleural effusions are noted. Postsurgical changes are again seen. IMPRESSION: Bibasilar changes increased particularly on the right from the prior exam with small pleural effusions. Electronically Signed   By: Inez Catalina M.D.   On: 01/06/2016 22:35     Assessment & Plan: Pt is a 70 y.o. male with a PMHx of Coronary artery disease, adenocarcinoma of the GE junction, cryptogenic cirrhosis, diabetes mellitus type 2, esophageal varices, GERD, hyperlipidemia, who was admitted to [facility] on 12/14/2015 for evaluation of nausea and abdominal pain.  Patient found to have left lower lobe pneumonia and subsequently developed acute respiratory failure and transition to the critical care unit.  1. Acute renal failure. Suspect acute tubular necrosis from hypotension which is likely secondary to underlying pneumonia. Continue IV fluid  hydration with 0.9 normal saline at 75 cc per hour. If oliguria persists we may need to consider renal replacement therapy however we will hold off for now. Await renal ultrasound report and check SPEP, UPEP, ANA, ANCA antibodies, GBM antibodies, C3, C4.  2. Acidosis. Appears to be respiratory primarily at this point in time. Continue BiPAP to lower PCO2 and raise pH.  3. Acute respiratory failure. Continue the patient on BiPAP to help correct underlying acidosis.  4. Bacterial pneumonia. Noted in the left lower lobe. Continue vancomycin and Zosyn for now.

## 2016-01-07 NOTE — Progress Notes (Signed)
Pharmacy Antibiotic Note  Mark Ford is a 70 y.o. male admitted on 12/12/2015 with sepsis.  Pharmacy has been consulted for vancomycin and piperacillin/tazobactam dosing.  Plan: After discussion with Dr. Ashby Dawes, MRSA PCR is negative and will d/c vancomycin.   Continue Zosyn 3.375 g EI q 8 hours.    Height: 5\' 8"  (172.7 cm) Weight: 205 lb 4.8 oz (93.1 kg) IBW/kg (Calculated) : 68.4  Temp (24hrs), Avg:98.3 F (36.8 C), Min:97.2 F (36.2 C), Max:98.9 F (37.2 C)   Recent Labs Lab 12/30/2015 1713  01/07/2016 1743 01/01/2016 2041 01/06/16 0009 01/06/16 0335 01/06/16 2233 01/07/16 0140  WBC  --   --  1.6*  --  2.6*  --  10.4 16.4*  CREATININE 0.72  --   --   --  1.02  --  2.55* 2.97*  LATICACIDVEN  --   < > 3.8* 2.7* 3.1* 4.0* 2.1* 1.6  < > = values in this interval not displayed.  Allergies  Allergen Reactions  . Shellfish Allergy Other (See Comments)    GOUT  . Tylenol [Acetaminophen] Other (See Comments)    Not allergic, but was told not to take it any more due to his liver condition.     Antimicrobials this admission: Piperacillin/tazobactam 07/26 >> Vancomycin 07/26 >> 7/28  Microbiology results: 07/26 BCx: NGTD x 2 07/26 UCx: mx species 7/27 MRSA PCR: negative  Thank you for allowing pharmacy to be a part of this patient's care.  Ulice Dash, PharmD Clinical Pharmacist  01/07/2016 11:48 AM

## 2016-01-07 NOTE — Progress Notes (Signed)
Received report from Holli Humbles., RN. Pt. Currently on comfort care with family present since 1830. Pt. HR in 60's, pt. Is drowsy RASS: -1, receiving 3L [place]. Pt. Confirmed he was experiencing pain so gave PRN.  Did mini-assessment pt. Has palpable pulses, diminished lung sounds and no UOP. Will continue to monitor pain and comfort.

## 2016-01-07 NOTE — Progress Notes (Signed)
Pt. Maintaining breathing on own, family requested transfer to 1C due to larger room capacity for family. Pt. Was transferred to previously obtained room with Sunday Spillers, RN who this RN had previously given report to. Transported on 3 L [place], wil relinquish care at this time.

## 2016-01-07 NOTE — Progress Notes (Signed)
RN took over for [employer]. Patient alert but lethargic and  only responds to some commands. Not speaking at this time. Pupils unequal. Marked changed from patient's earlier condition. O2 sats in 90s on venturi mask, no Notified Dana NP who came to assess patient with RN.

## 2016-01-07 NOTE — Progress Notes (Signed)
Bladder scan 41ml

## 2016-01-07 NOTE — Progress Notes (Signed)
Patient had no urine output during Kim RN's shift or when this RN took over around 16:00. Patient with altered mental status when this RN took over and patient placed back on Bipap. Became more responsive on bipap but still confused and started to stating to family and staff that he "want[ed] to die". Patient declined during shift, made comfort care after MDs discussion with family. Currently on 3 L nasal cannula per MD, resting with family. Prn morphine given when patient reported pain and while patient could tell RN it was severe pain he could not tell location. Family at bedside with patient and updated by MD and RN of care plan.

## 2016-01-07 NOTE — Progress Notes (Addendum)
Advanced Care Planning Note.   Discussed care of patient in presence of patient, wife, and other family members, ICU RN. Noted continued decline of patient status while in the hospital, explained prognosis is guarded.  Patient and family feel that he would never want to be on a ventilator, pt also expressed the thought that " I want to die" Wife at bedside and in full support of withdrawal of care. He does not want to be tried on dialysis nor does he want to be intubated. He maintains that he is in pain and he would like to be kept comfortable and allowed to pass away. Discussed with RN and ICU team, and placed orders. Patient was taken off of bipap so that he could speak with family further.  Orders placed for DNR, discontinued bipap. Orders placed for comfort measures only, morphine IV prn.   -Marda Stalker, M.D.  Time spent- 30 min.

## 2016-01-07 NOTE — Care Management (Signed)
RNCM rounded on patient and he is not feeling well enough to talk today. O2 is acute. RNCM will continue to follow.

## 2016-01-07 NOTE — Progress Notes (Signed)
Patient ID: Mark Ford, male   DOB: 03-04-1946, 70 y.o.   MRN: [id number]  [facility] PROGRESS NOTE  Mark Ford [id number] DOB: [birth year] DOA: 01/04/2016 PCP: Maryland Pink, MD  HPI/Subjective:   Objective: Vitals:   01/07/16 0500 01/07/16 0600  BP: (!) 95/40 (!) 120/51  Pulse: (!) 58 66  Resp: 16 19  Temp: 97.2 F (36.2 C)     Filed Weights   12/31/2015 1654 12/18/2015 2030  Weight: 93 kg (205 lb) 93.1 kg (205 lb 4.8 oz)    ROS: Review of Systems  Unable to perform ROS: Critical illness  Respiratory: Positive for shortness of breath.   Gastrointestinal: Positive for abdominal pain, nausea and vomiting.   Exam: Physical Exam  Constitutional: He appears lethargic.  HENT:  Nose: No mucosal edema.  Mouth/Throat: No oropharyngeal exudate or posterior oropharyngeal edema.  Eyes: Conjunctivae, EOM and lids are normal. Pupils are equal, round, and reactive to light.  Neck: No JVD present. Carotid bruit is not present. No edema present. No thyroid mass and no thyromegaly present.  Cardiovascular: S1 normal and S2 normal.  Exam reveals no gallop.   No murmur heard. Pulses:      Dorsalis pedis pulses are 2+ on the right side, and 2+ on the left side.  Respiratory: No respiratory distress. He has decreased breath sounds in the left middle field and the left lower field. He has no wheezes. He has rhonchi in the left middle field and the left lower field. He has no rales.  GI: Soft. Bowel sounds are normal. He exhibits distension. There is generalized tenderness.  Musculoskeletal:       Right ankle: He exhibits swelling.       Left ankle: He exhibits swelling.  Lymphadenopathy:    He has no cervical adenopathy.  Neurological: He appears lethargic.  Skin: Skin is warm. No rash noted. Nails show no clubbing.  Psychiatric: He has a normal mood and affect.      Data Reviewed: Basic Metabolic Panel:  Recent Labs Lab 12/16/2015 1713 01/06/16 0009 01/06/16 2233  01/07/16 0140  NA 143 143 139 140  K 4.6 3.7 4.8 5.3*  CL 105 109 105 106  CO2 27 26 25 26   GLUCOSE 78 87 119* 126*  BUN 13 16 32* 34*  CREATININE 0.72 1.02 2.55* 2.97*  CALCIUM 9.8 8.6* 8.1* 8.0*  MG  --   --   --  1.5*  PHOS  --   --   --  7.3*   Liver Function Tests:  Recent Labs Lab 01/02/2016 1713 01/06/16 2233 01/07/16 0140  AST 47* 30 30  ALT 13* 12* 13*  ALKPHOS 58 39 43  BILITOT 1.4* 1.9* 1.8*  PROT 7.1 6.7 6.9  ALBUMIN 4.0 3.4* 3.7    Recent Labs Lab 12/13/2015 1713 01/06/16 2233  LIPASE 22 13  AMYLASE  --  28   CBC:  Recent Labs Lab 01/03/2016 1743 01/06/16 0009 01/06/16 2233 01/07/16 0140  WBC 1.6* 2.6* 10.4 16.4*  HGB 10.2* 8.7* 9.1* 9.9*  HCT 32.6* 28.4* 30.3* 33.0*  MCV 78.7* 78.0* 81.4 80.5  PLT 92* 61* 71* 111*   CBG:  Recent Labs Lab 01/06/16 1439 01/06/16 1523 01/06/16 2114 01/07/16 0736 01/07/16 1135  GLUCAP 75 74 104* 136* 130*    Recent Results (from the past 240 hour(s))  Blood Culture (routine x 2)     Status: None (Preliminary result)   Collection Time: 12/18/2015  5:43 PM  Result  Value Ref Range Status   Specimen Description BLOOD RIGHT ASSIST CONTROL  Final   Special Requests BOTTLES DRAWN AEROBIC AND ANAEROBIC [place]  Final   Culture NO GROWTH < 24 HOURS  Final   Report Status PENDING  Incomplete  Blood Culture (routine x 2)     Status: None (Preliminary result)   Collection Time: 12/26/2015  5:44 PM  Result Value Ref Range Status   Specimen Description BLOOD LEFT ASSIST CONTROL  Final   Special Requests BOTTLES DRAWN AEROBIC AND ANAEROBIC [place]  Final   Culture NO GROWTH < 24 HOURS  Final   Report Status PENDING  Incomplete  Urine culture     Status: Abnormal   Collection Time: 12/29/2015  8:14 PM  Result Value Ref Range Status   Specimen Description URINE, RANDOM  Final   Special Requests NONE  Final   Culture MULTIPLE SPECIES PRESENT, SUGGEST RECOLLECTION (A)  Final   Report Status 01/07/2016 FINAL  Final  MRSA PCR  Screening     Status: None   Collection Time: 01/06/16  4:52 PM  Result Value Ref Range Status   MRSA by PCR NEGATIVE NEGATIVE Final    Comment:        The GeneXpert MRSA Assay (FDA approved for NASAL specimens only), is one component of a comprehensive MRSA colonization surveillance program. It is not intended to diagnose MRSA infection nor to guide or monitor treatment for MRSA infections.      Studies: Ct Abdomen Pelvis Wo Contrast  Result Date: 01/07/2016 CLINICAL DATA:  Abdominal pain.  Esophageal carcinoma.  Cirrhosis. EXAM: CT ABDOMEN AND PELVIS WITHOUT CONTRAST TECHNIQUE: Multidetector CT imaging of the abdomen and pelvis was performed following the standard protocol without IV contrast. COMPARISON:  PET-CT on 07/23/2015 FINDINGS: Lower chest: New small bilateral pleural effusions and bibasilar atelectasis. Mild cardiomegaly. Previous CABG. Hepatobiliary: Hepatic cirrhosis again demonstrated. No liver masses identified on this unenhanced exam. Recanalization of periumbilical veins, consistent with portal venous hypertension. Gallbladder is unremarkable. Pancreas: No mass or inflammatory process identified on this un-enhanced exam. Spleen: Mild splenomegaly with splenic length measuring approximately 14.5 cm, also consistent with portal venous hypertension. Adrenals/Urinary Tract: No evidence of urolithiasis or hydronephrosis. No definite mass visualized on this un-enhanced exam. Stomach/Bowel: No evidence of bowel obstruction. Colonic diverticulosis again seen, without evidence of diverticulitis. Vascular/Lymphatic: No pathologically enlarged lymph nodes. No evidence of abdominal aortic aneurysm. Aortic atherosclerosis noted. Mild to moderate varices seen in anterior left pelvis, also consistent with portal venous hypertension. Reproductive: Normal prostate and seminal vesicles. Other: Mild abdominal and pelvic ascites is new since prior exam. Increased diffuse mesenteric and body wall  edema. Musculoskeletal:  No suspicious bone lesions identified. IMPRESSION: Stable appearance of hepatic cirrhosis, and findings of portal venous hypertension including mild splenomegaly and abdominal and pelvic varices. New mild ascites and diffuse mesenteric and body wall edema. New small bilateral pleural effusions and bibasilar atelectasis. Colonic diverticulosis. No radiographic evidence of diverticulitis. Aortic atherosclerosis noted. Electronically Signed   By: Earle Gell M.D.   On: 01/07/2016 13:04  Dg Chest 1 View  Result Date: 01/07/2016 CLINICAL DATA:  70 year old who fell and complains of left rib pain. EXAM: LEFT RIBS WITH CHEST 3 VIEWS COMPARISON:  No prior rib x-rays. Chest x-rays 01/06/2016, 12/13/2015 and earlier. PET-CT 07/23/2015. CT chest 06/30/2015. FINDINGS: No fractures identified involving the left ribs. Mild osseous demineralization. Right jugular Port-A-Cath tip projects at the cavoatrial junction, unchanged. Suboptimal inspiration. Stable small to moderate-sized bilateral pleural effusions and  associated consolidation in the lower lobes, right greater than left. Pulmonary venous hypertension with perhaps mild interstitial pulmonary edema, more so than yesterday. Cardiac silhouette moderately enlarged, unchanged. IMPRESSION: 1. No left rib fractures identified. 2. Suboptimal inspiration. Mild CHF and/or fluid overload, with stable moderate cardiomegaly and mild interstitial pulmonary edema. 3. Stable bilateral pleural effusions and associated passive atelectasis in the lower lobes, right greater than left. Electronically Signed   By: Evangeline Dakin M.D.   On: 01/07/2016 13:13  Dg Chest 2 View  Result Date: 12/16/2015 CLINICAL DATA:  Nausea and vomiting beginning today. Hypoxia and weakness. Shortness of breath. EXAM: CHEST  2 VIEW COMPARISON:  Two-view chest x-ray 07/13/2015. FINDINGS: The heart is enlarged. A right IJ Port-A-Cath is stable. The tip is at the cavoatrial junction.  Lung volumes are low. Median sternotomy for CABG is noted. Left lower lobe pneumonia is present. A left pleural effusion is noted. IMPRESSION: 1. New left lower lobe pneumonia and pleural effusion. 2. Low lung volumes. 3. Stable right IJ Port-A-Cath. Electronically Signed   By: San Morelle M.D.   On: 12/15/2015 18:28  Dg Ribs Unilateral Left  Result Date: 01/07/2016 CLINICAL DATA:  70 year old who fell and complains of left rib pain. EXAM: LEFT RIBS WITH CHEST 3 VIEWS COMPARISON:  No prior rib x-rays. Chest x-rays 01/06/2016, 01/04/2016 and earlier. PET-CT 07/23/2015. CT chest 06/30/2015. FINDINGS: No fractures identified involving the left ribs. Mild osseous demineralization. Right jugular Port-A-Cath tip projects at the cavoatrial junction, unchanged. Suboptimal inspiration. Stable small to moderate-sized bilateral pleural effusions and associated consolidation in the lower lobes, right greater than left. Pulmonary venous hypertension with perhaps mild interstitial pulmonary edema, more so than yesterday. Cardiac silhouette moderately enlarged, unchanged. IMPRESSION: 1. No left rib fractures identified. 2. Suboptimal inspiration. Mild CHF and/or fluid overload, with stable moderate cardiomegaly and mild interstitial pulmonary edema. 3. Stable bilateral pleural effusions and associated passive atelectasis in the lower lobes, right greater than left. Electronically Signed   By: Evangeline Dakin M.D.   On: 01/07/2016 13:13  Dg Abd 1 View  Result Date: 01/06/2016 CLINICAL DATA:  70 year old male with abdominal pain EXAM: ABDOMEN - 1 VIEW COMPARISON:  Abdominal CT dated 12/02/2014 FINDINGS: There is a nonobstructive bowel gas pattern. Air is noted within the colon. No definite free air identified. No radiopaque calculi. There is degenerative changes of the spine. The soft tissues are grossly unremarkable. IMPRESSION: No evidence of bowel obstruction. Electronically Signed   By: Anner Crete M.D.    On: 01/06/2016 22:34  US Renal  Result Date: 01/07/2016 CLINICAL DATA:  70 year old male with acute renal failure EXAM: RENAL / URINARY TRACT ULTRASOUND COMPLETE COMPARISON:  PET-CT 07/23/2015 FINDINGS: Right Kidney: Length: 10.3 cm. Slightly echogenic renal parenchyma. No hydronephrosis. Small hypoechoic structure with an imperceptible rim measures 0.9 x 0.5 x 1.1 cm. At the deep margin of the structure there is a focal punctate calcification. Correlation with prior PET-CT imaging demonstrates a focal low-attenuation lesion with overlying cortical thinning in this location. This likely represents a small cyst with a peripheral calcification. The calcification is not visible by PET. Left Kidney: Length: 10.8 cm. Mildly echogenic renal parenchyma. No mass or hydronephrosis visualized. Bladder: Appears normal for degree of bladder distention. Other:  Hepatosplenomegaly. IMPRESSION: 1. No evidence of hydronephrosis. 2. Mildly echogenic renal parenchyma bilaterally as can be seen in the setting of medical renal disease. 3. Hepatosplenomegaly suggests underlying hepatic cirrhosis with portal hypertension. 4. Small volume ascites may be secondary to cirrhosis  and/or volume overload in the setting of renal dysfunction. 5. Probable small minimally complex renal cyst with a peripheral calcification in the interpolar right kidney. Electronically Signed   By: Jacqulynn Cadet M.D.   On: 01/07/2016 10:03  Dg Chest Port 1 View  Result Date: 01/06/2016 CLINICAL DATA:  History of pneumonia EXAM: PORTABLE CHEST 1 VIEW COMPARISON:  12/15/2015 FINDINGS: Cardiac shadow is again enlarged. A right-sided chest wall port is again seen. Patchy changes are again noted in the left lung base. Some increasing atelectatic changes are noted in the right lung base. Small bilateral pleural effusions are noted. Postsurgical changes are again seen. IMPRESSION: Bibasilar changes increased particularly on the right from the prior exam with  small pleural effusions. Electronically Signed   By: Inez Catalina M.D.   On: 01/06/2016 22:35   Scheduled Meds: . allopurinol  300 mg Oral Daily  . azithromycin  250 mg Oral Daily  . heparin subcutaneous  5,000 Units Subcutaneous Q8H  . insulin aspart  0-5 Units Subcutaneous QHS  . insulin aspart  0-9 Units Subcutaneous TID WC  . ipratropium-albuterol  3 mL Nebulization Q6H  . levothyroxine  100 mcg Oral QAC breakfast  . nadolol  10 mg Oral QPM  . pantoprazole  40 mg Oral BID  . piperacillin-tazobactam (ZOSYN)  IV  3.375 g Intravenous Q8H  . simvastatin  40 mg Oral q1800    Assessment/Plan:  1. Acute hypoxic, hypercarbic respiratory failure with severe acidosis. Patient required BiPAP overnight and this morning. Patient now on Ventimask 2. Severe sepsis present on admission with leukopenia tachycardia and left lower lobe pneumonia with left pleural effusion. Now with multiorgan failure. Patient on Zosyn and Zithromax. 3. Acute renal failure. Likely ATN. IV fluid bolus and hydration. Bladder scan did not show a lot of urine. 4. Pancytopenia. History of cryptogenic cirrhosis. White count now elevated. 5. History of esophageal varices on labetalol 6. Type 2 diabetes. Hold medications. 7. Hypothyroidism unspecified on Synthroid 8. GERD on Protonix 9. History of esophageal cancer  Code Status:     Code Status Orders        Start     Ordered   01/09/2016 2103  Full code  Continuous     12/14/2015 2102    Code Status History    Date Active Date Inactive Code Status Order ID Comments User Context   06/22/2015 12:33 PM 06/25/2015  5:56 PM Full Code [id number]  Hillary Bow, MD ED     Family Communication: Case discussed with Son at the bedside Disposition Plan: To be determined  Consultants:  Critical care specialist  Antibiotics:  Zosyn  Zithromax  Time spent: Patient is still critically ill and requires CCU care. 30 minutes spent in taking care of the patient in  coordination of care.  Loletha Grayer  [employer]

## 2016-01-07 NOTE — Progress Notes (Signed)
Called report to 1C for pt. Transfer.  During transfer to floor bed pt. Began to breathing agonally and was unresponsive. Called family back to ICU and will monitor as floor comfort care.

## 2016-01-07 NOTE — Progress Notes (Signed)
Pts condition declining he is no longer following commands, pupils unequal left 1 mm right 2 mm sluggish, currently on 55% venturi mask.  Spoke with pts wife, daughter, and son they stated pt would not want to be intubated however if pt were to cardiac arrest the family wants CPR, ACLS medications, and defibrillation or cardioversion.  Family is agreeable to [place].  Will place pt on Bipap and obtain CT of head once pt more stable.  Marda Stalker, [place] Pager [phone] (please enter 7 digits) PCCM Consult Pager [phone] (please enter 7 digits)

## 2016-01-07 NOTE — Progress Notes (Signed)
PT Cancellation Note  Patient Details Name: Mark Ford MRN: [id number] DOB: [birth year]   Cancelled Treatment:    Reason Eval/Treat Not Completed: Medical issues which prohibited therapy (Consult received and chart reviewed.  Per discussion with primary RN, patient with abdominal pain, decreased respiratory status (preparing to leave unit for testing).  Recommends hold this date with re-attempt next date as medically appropriate.)   Jameah Rouser H. Owens Shark, PT, DPT, NCS 01/07/16, 11:31 AM [phone]

## 2016-01-10 ENCOUNTER — Telehealth: Payer: Self-pay

## 2016-01-10 LAB — CULTURE, BLOOD (ROUTINE X 2)
CULTURE: NO GROWTH
Culture: NO GROWTH

## 2016-01-10 NOTE — Telephone Encounter (Signed)
Patient is deceased cancel all appts

## 2016-01-11 NOTE — Progress Notes (Signed)
Pt expired at 04:04 am 10-Jan-2016. He was pronounced death by Olena Mater, Rn and [address], [place]. Family at bedside. MD Rosilyn Mings was notified. Nursing Supervisor Rojelio Brenner was notified. Family refused a chaplin, they stated that "they had a family member, pastor, who was notified".

## 2016-01-11 DEATH — deceased

## 2016-02-08 ENCOUNTER — Ambulatory Visit: Admission: RE | Admit: 2016-02-08 | Payer: Medicare Other | Source: Ambulatory Visit

## 2016-02-11 NOTE — Discharge Summary (Signed)
[facility] NAME: Mark Ford    MR#:  [id number]  DATE OF BIRTH:  02-17-1946  DATE OF ADMISSION:  01/07/2016 ADMITTING PHYSICIAN: Henreitta Leber, MD  DATE OF DEATH: 26-Jan-2016  4:04 AM  PRIMARY CARE PHYSICIAN: Maryland Pink, MD    ADMISSION DIAGNOSIS:  CAP (community acquired pneumonia) [J18.9] Sepsis, due to unspecified organism ([place]) [A41.9]  DISCHARGE DIAGNOSIS:  Active Problems:   Pneumonia   SECONDARY DIAGNOSIS:   Past Medical History:  Diagnosis Date  . CAD (coronary artery disease)   . 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 Cirrhosis, cryptogenic ([place])   . Diabetes mellitus without complication ([place])   . Esophageal varices ([place])   . GERD (gastroesophageal reflux disease)   . Heart failure ([place])   . Hyperlipidemia   . S/P CABG (coronary artery bypass graft)   . Thyroid disease    hypothroidism    HOSPITAL COURSE:   1. Acute hypoxic hypercarbic respiratory failure with severe acidosis. Patient required BiPAP on the 27th into 28th of July. Was able to come off onto Ventimask. Patient was made comfort care measures by critical care specialist on 01/07/2016 in the evening and the patient passed away [phone] at 4:04 AM. 2. Severe sepsis. Sepsis present on admission that worsened during the hospital course. The patient had initially leukopenia and then leukocytosis, tachycardia and left lower lobe pneumonia with pleural effusion. Patient developed multiorgan failure including acute encephalopathy, acute respiratory failure and acute renal failure. Patient was on triple antibiotics and then vancomycin was discontinued because MRSA screen was negative. The patient was on Zosyn and Zithromax. 3. Acute kidney injury likely ATN. Patient was given IV fluid hydration and boluses. 4. Initial pancytopenia patient has a history of cryptogenic cirrhosis. 5. History of esophageal varices on nadolol during hospital  course 6. Type 2 diabetes. His oral medications were on hold 7. Hypothyroidism unspecified, on Synthroid during hospital course 8. GERD on Protonix during hospital course 9. History of esophageal cancer   CONSULTS OBTAINED:  Treatment Team:  Anthonette Legato, MD  DRUG ALLERGIES:   Allergies  Allergen Reactions  . Shellfish Allergy Other (See Comments)    GOUT  . Tylenol [Acetaminophen] Other (See Comments)    Not allergic, but was told not to take it any more due to his liver condition.      DATA REVIEW:   CBC  Recent Labs Lab 01/07/16 1522  WBC 11.1*  HGB 9.4*  HCT 30.9*  PLT 81*    Chemistries   Recent Labs Lab 01/07/16 0140  NA 140  K 5.3*  CL 106  CO2 26  GLUCOSE 126*  BUN 34*  CREATININE 2.97*  CALCIUM 8.0*  MG 1.5*  AST 30  ALT 13*  ALKPHOS 43  BILITOT 1.8*    Cardiac Enzymes  Recent Labs Lab 01/03/2016 1713  TROPONINI <0.03    Microbiology Results  Results for orders placed or performed during the hospital encounter of 01/09/2016  Blood Culture (routine x 2)     Status: None   Collection Time: 01/04/2016  5:43 PM  Result Value Ref Range Status   Specimen Description BLOOD RIGHT ASSIST CONTROL  Final   Special Requests BOTTLES DRAWN AEROBIC AND ANAEROBIC [place]  Final   Culture NO GROWTH 5 DAYS  Final   Report Status 01/10/2016 FINAL  Final  Blood Culture (routine x 2)     Status: None   Collection  Time: 12/29/2015  5:44 PM  Result Value Ref Range Status   Specimen Description BLOOD LEFT ASSIST CONTROL  Final   Special Requests BOTTLES DRAWN AEROBIC AND ANAEROBIC [place]  Final   Culture NO GROWTH 5 DAYS  Final   Report Status 01/10/2016 FINAL  Final  Urine culture     Status: Abnormal   Collection Time: 01/07/2016  8:14 PM  Result Value Ref Range Status   Specimen Description URINE, RANDOM  Final   Special Requests NONE  Final   Culture MULTIPLE SPECIES PRESENT, SUGGEST RECOLLECTION (A)  Final   Report Status 01/07/2016 FINAL  Final  MRSA PCR  Screening     Status: None   Collection Time: 01/06/16  4:52 PM  Result Value Ref Range Status   MRSA by PCR NEGATIVE NEGATIVE Final    Comment:        The GeneXpert MRSA Assay (FDA approved for NASAL specimens only), is one component of a comprehensive MRSA colonization surveillance program. It is not intended to diagnose MRSA infection nor to guide or monitor treatment for MRSA infections.     CODE STATUS:  Code Status History    Date Active Date Inactive Code Status Order ID Comments User Context   01/07/2016  6:07 PM 03-Feb-2016  9:14 AM DNR [id number]  Laverle Hobby, MD Inpatient   01/07/2016  4:46 PM 01/07/2016  6:07 PM Partial Code [id number]  Awilda Bill, NP Inpatient   12/19/2015  9:02 PM 01/07/2016  4:45 PM Full Code [id number]  Henreitta Leber, MD Inpatient   06/22/2015 12:33 PM 06/25/2015  5:56 PM Full Code [id number]  Hillary Bow, MD ED    Questions for Most Recent Historical Code Status (Order [id number])    Question Answer Comment   In the event of cardiac or respiratory ARREST Do not call a "code blue"    In the event of cardiac or respiratory ARREST Do not perform Intubation, CPR, defibrillation or ACLS    In the event of cardiac or respiratory ARREST Use medication by any route, position, wound care, and other measures to relive pain and suffering. May use oxygen, suction and manual treatment of airway obstruction as needed for comfort.       Loletha Grayer M.D on 01/12/2016 at 1:36 PM  Between 7am to 6pm - Pager - [phone]  After 6pm go to [email or web address] - password [email or web address] [facility]  [phone]  CC: Primary care physician; Maryland Pink, MD

## 2016-02-11 DEATH — deceased

## 2016-03-02 ENCOUNTER — Other Ambulatory Visit: Payer: Medicare Other

## 2016-03-02 ENCOUNTER — Ambulatory Visit: Payer: Medicare Other | Admitting: Oncology

## 2016-04-03 ENCOUNTER — Ambulatory Visit: Payer: Medicare Other | Admitting: Radiation Oncology

## 2018-02-05 IMAGING — CT NM PET TUM IMG RESTAG (PS) SKULL BASE T - THIGH
1 of 10 series · 1 of 25 positions shown · non-contrast
Comparison: Neck CT of 06/30/2015. Chest CT 06/30/2015. Prior PET
of 08/12/2014.

CLINICAL DATA: Subsequent treatment strategy for malignant neoplasm
of lower third of esophagus. Cirrhosis. No recent chemotherapy or
radiation therapy..

EXAM:
NUCLEAR MEDICINE PET SKULL BASE TO THIGH
TECHNIQUE: 11.7 mCi F-18 FDG was injected intravenously. Full-ring PET imaging
was performed from the skull base to thigh after the radiotracer. CT
data was obtained and used for attenuation correction and anatomic
localization.
FASTING BLOOD GLUCOSE:  Value: 85 mg/dl

[Series 3: ct wb 5.0 b30f · axial · 5.0mm · 0.98mm/px · 1 of 329 slices shown]
[im 329/329  brain]
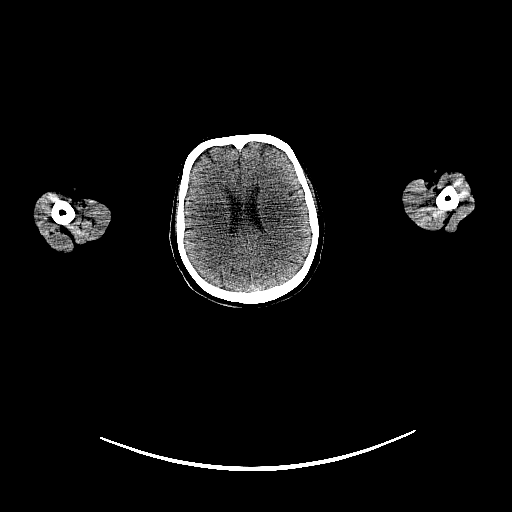

[1 of 25 positions shown; findings below may reference images not displayed]

FINDINGS: NECK

Motion between the PET and CT images. area of apparent left
calvarial hypermetabolism is likely was registered and indicative of
parietal scalp activity. This measures a S.U.V. max of 6.1. No CT
correlate, including on approximately image 2/series 3.

Apparent hypermetabolism about the skin of the right shoulder is
likely image registered and arising from the region of the adjacent
right sided neck. No well-defined skin lesion identified.

No hypermetabolic cervical nodes.

CHEST

No hypermetabolic thoracic nodes, including within the left
subcarinal station. The nodes detailed on prior neck CT are
decreased in size today, likely reactive, including on image
53/series 3.

Hypermetabolism is identified within the distal esophagus in the
region of mild wall thickening. This measures a S.U.V. max of,
including on image 124/series 3.

ABDOMEN/PELVIS

No areas of abnormal hypermetabolism.

SKELETON

No abnormal marrow activity.

CT IMAGES PERFORMED FOR ATTENUATION CORRECTION

Mucosal thickening of right maxillary sinus. Left maxillary sinus
mucous retention cyst or polyp. Neck findings deferred to recent
diagnostic CT. Right Port-A-Cath terminates at the caval/ atrial
junction. Mild bilateral gynecomastia. Chest findings deferred to
recent diagnostic CT. Centrilobular emphysema. Mild cardiomegaly
with median sternotomy.

Cirrhosis with small volume perihepatic ascites. Portosystemic
collaterals, consistent with portal venous hypertension. Extensive
colonic diverticulosis. Trace cul-de-sac fluid, new.
IMPRESSION: 1. The left supraclavicular nodes detailed on prior neck CT are
decreased in size and not hypermetabolic, most consistent with a
benign/reactive etiology.
2. Low-level hypermetabolism within the distal esophagus which is
nonspecific and could be physiologic or treatment related. No
well-defined esophageal mass.
3. Otherwise, no evidence of metastatic disease.
4. Misregistration between CT and PET images within the neck. Areas
of mild hypermetabolism in the region of the left parietal scalp and
superficial low right neck are indeterminate. Consider physical exam
correlation.
5. Cirrhosis and portal venous hypertension with development of
small volume abdominal pelvic ascites since 08/12/2014.
